# Patient Record
Sex: Female | Born: 1985 | Race: Black or African American | Hispanic: No | Marital: Single | Smoking: Former smoker
Health system: Southern US, Community
[De-identification: ages and names within clinical notes are randomized; demographics above are authoritative.]

## PROBLEM LIST (undated history)

## (undated) ENCOUNTER — Inpatient Hospital Stay (HOSPITAL_COMMUNITY): Payer: Self-pay

## (undated) DIAGNOSIS — N390 Urinary tract infection, site not specified: Secondary | ICD-10-CM

## (undated) DIAGNOSIS — A749 Chlamydial infection, unspecified: Secondary | ICD-10-CM

## (undated) DIAGNOSIS — A599 Trichomoniasis, unspecified: Secondary | ICD-10-CM

---

## 2006-01-31 ENCOUNTER — Ambulatory Visit (HOSPITAL_COMMUNITY): Admission: RE | Admit: 2006-01-31 | Discharge: 2006-01-31 | Payer: Self-pay | Admitting: Gynecology

## 2006-04-10 ENCOUNTER — Ambulatory Visit (HOSPITAL_COMMUNITY): Admission: RE | Admit: 2006-04-10 | Discharge: 2006-04-10 | Payer: Self-pay | Admitting: Gynecology

## 2006-05-01 ENCOUNTER — Ambulatory Visit (HOSPITAL_COMMUNITY): Admission: RE | Admit: 2006-05-01 | Discharge: 2006-05-01 | Payer: Self-pay | Admitting: Gynecology

## 2006-06-27 ENCOUNTER — Inpatient Hospital Stay (HOSPITAL_COMMUNITY): Admission: AD | Admit: 2006-06-27 | Discharge: 2006-06-27 | Payer: Self-pay | Admitting: Gynecology

## 2006-06-27 ENCOUNTER — Ambulatory Visit: Payer: Self-pay | Admitting: *Deleted

## 2006-06-30 ENCOUNTER — Ambulatory Visit: Payer: Self-pay | Admitting: Obstetrics & Gynecology

## 2006-06-30 ENCOUNTER — Ambulatory Visit (HOSPITAL_COMMUNITY): Admission: RE | Admit: 2006-06-30 | Discharge: 2006-06-30 | Payer: Self-pay | Admitting: Obstetrics & Gynecology

## 2006-07-03 ENCOUNTER — Ambulatory Visit: Payer: Self-pay | Admitting: Obstetrics and Gynecology

## 2006-07-03 ENCOUNTER — Inpatient Hospital Stay (HOSPITAL_COMMUNITY): Admission: AD | Admit: 2006-07-03 | Discharge: 2006-07-05 | Payer: Self-pay | Admitting: Obstetrics & Gynecology

## 2006-09-20 ENCOUNTER — Emergency Department (HOSPITAL_COMMUNITY): Admission: EM | Admit: 2006-09-20 | Discharge: 2006-09-20 | Payer: Self-pay | Admitting: Family Medicine

## 2007-12-07 ENCOUNTER — Inpatient Hospital Stay (HOSPITAL_COMMUNITY): Admission: AD | Admit: 2007-12-07 | Discharge: 2007-12-07 | Payer: Self-pay | Admitting: Gynecology

## 2008-05-25 ENCOUNTER — Inpatient Hospital Stay (HOSPITAL_COMMUNITY): Admission: AD | Admit: 2008-05-25 | Discharge: 2008-05-28 | Payer: Self-pay | Admitting: Obstetrics

## 2010-08-30 LAB — RPR: RPR Ser Ql: NONREACTIVE

## 2010-08-30 LAB — CBC
MCHC: 33.9 g/dL (ref 30.0–36.0)
MCV: 90 fL (ref 78.0–100.0)
Platelets: 253 10*3/uL (ref 150–400)
Platelets: 257 10*3/uL (ref 150–400)
RDW: 13.1 % (ref 11.5–15.5)
WBC: 8.7 10*3/uL (ref 4.0–10.5)

## 2011-02-11 LAB — URINALYSIS, ROUTINE W REFLEX MICROSCOPIC
Bilirubin Urine: NEGATIVE
Nitrite: POSITIVE — AB
Specific Gravity, Urine: 1.025
Urobilinogen, UA: 0.2
pH: 6.5

## 2011-02-11 LAB — URINE CULTURE: Colony Count: >=100000

## 2011-02-11 LAB — URINE MICROSCOPIC-ADD ON

## 2011-02-11 LAB — WET PREP, GENITAL: Yeast Wet Prep HPF POC: NONE SEEN

## 2014-04-01 ENCOUNTER — Encounter (HOSPITAL_COMMUNITY): Payer: Self-pay | Admitting: *Deleted

## 2014-04-01 ENCOUNTER — Inpatient Hospital Stay (HOSPITAL_COMMUNITY)
Admission: AD | Admit: 2014-04-01 | Discharge: 2014-04-01 | Disposition: A | Discharge status: Home / Self Care | Payer: Self-pay | Source: Ambulatory Visit | Attending: Family Medicine | Admitting: Family Medicine

## 2014-04-01 DIAGNOSIS — M545 Low back pain: Secondary | ICD-10-CM | POA: Insufficient documentation

## 2014-04-01 DIAGNOSIS — A5901 Trichomonal vulvovaginitis: Secondary | ICD-10-CM

## 2014-04-01 DIAGNOSIS — S39012A Strain of muscle, fascia and tendon of lower back, initial encounter: Secondary | ICD-10-CM

## 2014-04-01 DIAGNOSIS — N390 Urinary tract infection, site not specified: Secondary | ICD-10-CM

## 2014-04-01 DIAGNOSIS — M79605 Pain in left leg: Secondary | ICD-10-CM | POA: Insufficient documentation

## 2014-04-01 DIAGNOSIS — Z202 Contact with and (suspected) exposure to infections with a predominantly sexual mode of transmission: Secondary | ICD-10-CM

## 2014-04-01 HISTORY — DX: Chlamydial infection, unspecified: A74.9

## 2014-04-01 HISTORY — DX: Urinary tract infection, site not specified: N39.0

## 2014-04-01 HISTORY — DX: Trichomoniasis, unspecified: A59.9

## 2014-04-01 LAB — POCT PREGNANCY, URINE: Preg Test, Ur: NEGATIVE

## 2014-04-01 LAB — CBC
HEMATOCRIT: 37.7 % (ref 36.0–46.0)
Hemoglobin: 13.1 g/dL (ref 12.0–15.0)
MCH: 30.9 pg (ref 26.0–34.0)
MCHC: 34.7 g/dL (ref 30.0–36.0)
MCV: 88.9 fL (ref 78.0–100.0)
Platelets: 278 10*3/uL (ref 150–400)
RBC: 4.24 MIL/uL (ref 3.87–5.11)
RDW: 12.7 % (ref 11.5–15.5)
WBC: 9.5 10*3/uL (ref 4.0–10.5)

## 2014-04-01 LAB — URINALYSIS, ROUTINE W REFLEX MICROSCOPIC
Bilirubin Urine: NEGATIVE
Glucose, UA: NEGATIVE mg/dL
KETONES UR: NEGATIVE mg/dL
NITRITE: POSITIVE — AB
PH: 7 (ref 5.0–8.0)
Protein, ur: NEGATIVE mg/dL
SPECIFIC GRAVITY, URINE: 1.01 (ref 1.005–1.030)
UROBILINOGEN UA: 0.2 mg/dL (ref 0.0–1.0)

## 2014-04-01 LAB — WET PREP, GENITAL
Clue Cells Wet Prep HPF POC: NONE SEEN
Yeast Wet Prep HPF POC: NONE SEEN

## 2014-04-01 LAB — URINE MICROSCOPIC-ADD ON

## 2014-04-01 MED ORDER — METRONIDAZOLE 500 MG PO TABS
2000.0000 mg | ORAL_TABLET | Freq: Once | ORAL | Status: AC
  Administered 2014-04-01: 2000 mg via ORAL
  Filled 2014-04-01: qty 4

## 2014-04-01 MED ORDER — IBUPROFEN 200 MG PO TABS: 600.0000 mg | ORAL_TABLET | Freq: Four times a day (QID) | ORAL | Status: DC | PRN

## 2014-04-01 MED ORDER — CYCLOBENZAPRINE HCL 10 MG PO TABS: 5.0000 mg | ORAL_TABLET | Freq: Three times a day (TID) | ORAL | Status: DC | PRN

## 2014-04-01 MED ORDER — SULFAMETHOXAZOLE-TRIMETHOPRIM 800-160 MG PO TABS: 1.0000 | ORAL_TABLET | Freq: Two times a day (BID) | ORAL | Status: DC

## 2014-04-01 NOTE — Discharge Instructions (Signed)
Muscle Cramps and Spasms Muscle cramps and spasms occur when a muscle or muscles tighten and you have no control over this tightening (involuntary muscle contraction). They are a common problem and can develop in any muscle. The most common place is in the calf muscles of the leg. Both muscle cramps and muscle spasms are involuntary muscle contractions, but they also have differences:   Muscle cramps are sporadic and painful. They may last a few seconds to a quarter of an hour. Muscle cramps are often more forceful and last longer than muscle spasms.  Muscle spasms may or may not be painful. They may also last just a few seconds or much longer. CAUSES  It is uncommon for cramps or spasms to be due to a serious underlying problem. In many cases, the cause of cramps or spasms is unknown. Some common causes are:   Overexertion.   Overuse from repetitive motions (doing the same thing over and over).   Remaining in a certain position for a long period of time.   Improper preparation, form, or technique while performing a sport or activity.   Dehydration.   Injury.   Side effects of some medicines.   Abnormally low levels of the salts and ions in your blood (electrolytes), especially potassium and calcium. This could happen if you are taking water pills (diuretics) or you are pregnant.  Some underlying medical problems can make it more likely to develop cramps or spasms. These include, but are not limited to:   Diabetes.   Parkinson disease.   Hormone disorders, such as thyroid problems.   Alcohol abuse.   Diseases specific to muscles, joints, and bones.   Blood vessel disease where not enough blood is getting to the muscles.  HOME CARE INSTRUCTIONS   Stay well hydrated. Drink enough water and fluids to keep your urine clear or pale yellow.  It may be helpful to massage, stretch, and relax the affected muscle.  For tight or tense muscles, use a warm towel, heating  pad, or hot shower water directed to the affected area.  If you are sore or have pain after a cramp or spasm, applying ice to the affected area may relieve discomfort.  Put ice in a plastic bag.  Place a towel between your skin and the bag.  Leave the ice on for 15-20 minutes, 03-04 times a day.  Medicines used to treat a known cause of cramps or spasms may help reduce their frequency or severity. Only take over-the-counter or prescription medicines as directed by your caregiver. SEEK MEDICAL CARE IF:  Your cramps or spasms get more severe, more frequent, or do not improve over time.  MAKE SURE YOU:   Understand these instructions.  Will watch your condition.  Will get help right away if you are not doing well or get worse. Document Released: 10/22/2001 Document Revised: 08/27/2012 Document Reviewed: 04/18/2012 [facility] Patient Information 2015 [place]. This information is not intended to replace advice given to you by your health care provider. Make sure you discuss any questions you have with your health care provider.  Trichomoniasis Trichomoniasis is an infection caused by an organism called Trichomonas. The infection can affect both women and men. In women, the outer female genitalia and the vagina are affected. In men, the penis is mainly affected, but the prostate and other reproductive organs can also be involved. Trichomoniasis is a sexually transmitted infection (STI) and is most often passed to another person through sexual contact.  RISK FACTORS  Having unprotected sexual intercourse.  Having sexual intercourse with an infected partner. SIGNS AND SYMPTOMS  Symptoms of trichomoniasis in women include:  Abnormal gray-green frothy vaginal discharge.  Itching and irritation of the vagina.  Itching and irritation of the area outside the vagina. Symptoms of trichomoniasis in men include:   Penile discharge with or without pain.  Pain during urination. This  results from inflammation of the urethra. DIAGNOSIS  Trichomoniasis may be found during a Pap test or physical exam. Your health care provider may use one of the following methods to help diagnose this infection:  Examining vaginal discharge under a microscope. For men, urethral discharge would be examined.  Testing the pH of the vagina with a test tape.  Using a vaginal swab test that checks for the Trichomonas organism. A test is available that provides results within a few minutes.  Doing a culture test for the organism. This is not usually needed. TREATMENT   You may be given medicine to fight the infection. Women should inform their health care provider if they could be or are pregnant. Some medicines used to treat the infection should not be taken during pregnancy.  Your health care provider may recommend over-the-counter medicines or creams to decrease itching or irritation.  Your sexual partner will need to be treated if infected. HOME CARE INSTRUCTIONS   Take medicines only as directed by your health care provider.  Take over-the-counter medicine for itching or irritation as directed by your health care provider.  Do not have sexual intercourse while you have the infection.  Women should not douche or wear tampons while they have the infection.  Discuss your infection with your partner. Your partner may have gotten the infection from you, or you may have gotten it from your partner.  Have your sex partner get examined and treated if necessary.  Practice safe, informed, and protected sex.  See your health care provider for other STI testing. SEEK MEDICAL CARE IF:   You still have symptoms after you finish your medicine.  You develop abdominal pain.  You have pain when you urinate.  You have bleeding after sexual intercourse.  You develop a rash.  Your medicine makes you sick or makes you throw up (vomit). MAKE SURE YOU:  Understand these instructions.  Will  watch your condition.  Will get help right away if you are not doing well or get worse. Document Released: 10/26/2000 Document Revised: 09/16/2013 Document Reviewed: 02/11/2013 [facility] Patient Information 2015 [place], [place]. This information is not intended to replace advice given to you by your health care provider. Make sure you discuss any questions you have with your health care provider.  Urinary Tract Infection Urinary tract infections (UTIs) can develop anywhere along your urinary tract. Your urinary tract is your body's drainage system for removing wastes and extra water. Your urinary tract includes two kidneys, two ureters, a bladder, and a urethra. Your kidneys are a pair of bean-shaped organs. Each kidney is about the size of your fist. They are located below your ribs, one on each side of your spine. CAUSES Infections are caused by microbes, which are microscopic organisms, including fungi, viruses, and bacteria. These organisms are so small that they can only be seen through a microscope. Bacteria are the microbes that most commonly cause UTIs. SYMPTOMS  Symptoms of UTIs may vary by age and gender of the patient and by the location of the infection. Symptoms in young women typically include a frequent and intense urge to  urinate and a painful, burning feeling in the bladder or urethra during urination. Older women and men are more likely to be tired, shaky, and weak and have muscle aches and abdominal pain. A fever may mean the infection is in your kidneys. Other symptoms of a kidney infection include pain in your back or sides below the ribs, nausea, and vomiting. DIAGNOSIS To diagnose a UTI, your caregiver will ask you about your symptoms. Your caregiver also will ask to provide a urine sample. The urine sample will be tested for bacteria and white blood cells. White blood cells are made by your body to help fight infection. TREATMENT  Typically, UTIs can be treated with medication.  Because most UTIs are caused by a bacterial infection, they usually can be treated with the use of antibiotics. The choice of antibiotic and length of treatment depend on your symptoms and the type of bacteria causing your infection. HOME CARE INSTRUCTIONS  If you were prescribed antibiotics, take them exactly as your caregiver instructs you. Finish the medication even if you feel better after you have only taken some of the medication.  Drink enough water and fluids to keep your urine clear or pale yellow.  Avoid caffeine, tea, and carbonated beverages. They tend to irritate your bladder.  Empty your bladder often. Avoid holding urine for long periods of time.  Empty your bladder before and after sexual intercourse.  After a bowel movement, women should cleanse from front to back. Use each tissue only once. SEEK MEDICAL CARE IF:   You have back pain.  You develop a fever.  Your symptoms do not begin to resolve within 3 days. SEEK IMMEDIATE MEDICAL CARE IF:   You have severe back pain or lower abdominal pain.  You develop chills.  You have nausea or vomiting.  You have continued burning or discomfort with urination. MAKE SURE YOU:   Understand these instructions.  Will watch your condition.  Will get help right away if you are not doing well or get worse. Document Released: 02/09/2005 Document Revised: 11/01/2011 Document Reviewed: 06/10/2011 [facility] Patient Information 2015 [place], [place]. This information is not intended to replace advice given to you by your health care provider. Make sure you discuss any questions you have with your health care provider.

## 2014-04-01 NOTE — MAU Note (Addendum)
Very low back/buttock pain started last night, more on L side and down back of L leg.

## 2014-04-01 NOTE — MAU Note (Signed)
Pain in left buttock started last night.  When she walks it shoots down left leg

## 2014-04-02 ENCOUNTER — Telehealth: Payer: Self-pay

## 2014-04-02 LAB — GC/CHLAMYDIA PROBE AMP
CT Probe RNA: POSITIVE — AB
GC Probe RNA: NEGATIVE

## 2014-04-02 MED ORDER — AZITHROMYCIN 250 MG PO TABS: 1000.0000 mg | ORAL_TABLET | Freq: Once | ORAL | Status: DC

## 2014-04-02 NOTE — Telephone Encounter (Signed)
-----   Message from Pennie BanterMarni W Smith sent at 04/02/2014 10:43 AM EST ----- Telephone call to patient regarding positive chlamydia culture, patient notified.  Patient has not been treated and will [facility] [address]  Instructed patient to notify her partner for treatment.  Report faxed to health department.

## 2014-04-02 NOTE — Telephone Encounter (Signed)
Called pt and left message that [facility] has [facility] [address]  If she has any questions to please give us a call.

## 2014-04-07 NOTE — MAU Provider Note (Signed)
History     CSN: [id number]  Arrival date and time: 04/01/14 [id number]   First Provider Initiated Contact with Patient 04/01/14 1018      Chief Complaint  Patient presents with  . Back Pain   Back Pain This is a new (Onset last night) problem. The current episode started yesterday (While sitting). The problem occurs intermittently (During ambulation and when moving certain ways). The problem has been gradually improving since onset. The pain is present in the sacro-iliac. The quality of the pain is described as shooting. The pain radiates to the left thigh. The pain is at a severity of 8/10. The pain is severe. The pain is the same all the time. The symptoms are aggravated by standing, twisting and position. Stiffness is present in the morning. Associated symptoms include weakness. Pertinent negatives include no abdominal pain, bladder incontinence, bowel incontinence, chest pain, dysuria, fever, headaches, leg pain, numbness, paresis, paresthesias, pelvic pain or tingling.    OB History    Gravida Para Term Preterm AB TAB SAB Ectopic Multiple Living   2 2 2       2       Past Medical History  Diagnosis Date  . Chlamydia   . Urinary tract infection   . Trichomonas infection     Past Surgical History  Procedure Laterality Date  . Vaginal delivery      Family History  Problem Relation Age of Onset  . Hypertension Father   . Diabetes Father   . Diabetes Paternal Grandmother   . Hypertension Paternal Grandmother     History  Substance Use Topics  . Smoking status: Current Every Day Smoker -- 0.25 packs/day    Types: Cigarettes  . Smokeless tobacco: Never Used  . Alcohol Use: No    Allergies: No Known Allergies  No prescriptions prior to admission    Review of Systems  Constitutional: Negative for fever.  Cardiovascular: Negative for chest pain.  Gastrointestinal: Negative for abdominal pain and bowel incontinence.  Genitourinary: Negative for bladder incontinence,  dysuria and pelvic pain.  Musculoskeletal: Positive for back pain.  Neurological: Positive for weakness. Negative for tingling, numbness, headaches and paresthesias.   Physical Exam   Blood pressure 120/78, pulse 72, temperature 98.4 F (36.9 C), temperature source Oral, resp. rate 16, height 5\' 2"  (1.575 m), weight 97.977 kg (216 lb), last menstrual period 02/15/2014.  Physical Exam  Constitutional: She appears well-developed and well-nourished.  Cardiovascular: Normal rate, regular rhythm and normal heart sounds.   Respiratory: Effort normal and breath sounds normal.  Musculoskeletal:  Decreased ROM with flexion. Normal ROM with rotation and rocking side to side.  Skin: Skin is warm and dry.    MAU Course  Procedures Flagyl given in maternity admissions  Assessment and Plan   1. Back strain, initial encounter   2. UTI (lower urinary tract infection)   3. Trichomonas vaginitis   4. STD exposure    Discharge home in stable condition. GC/chlamydia cultures, hep B, hep C, RPR, HIV pending. Expedited partner therapy. No sexual intercourse until one week after both patient and partner have been treated. Always use condoms. Follow-up Information    [facility]   Specialty:  Urgent Care   Why:  As needed if symptoms worsen   Contact information:   [address] [place] [id number] [phone]      [facility] [place]   Why:  for routine gyneology care   Contact information:   [address] [id number] [phone]      Follow up with Pap smears.   Contact information:   [phone]        Medication List    TAKE these medications        cyclobenzaprine 10 MG tablet  Commonly known as:  FLEXERIL  Take 0.5-1 tablets (5-10 mg total) by mouth every 8 (eight) hours as needed for muscle spasms.     ibuprofen 200 MG tablet  Commonly known as:   ADVIL,MOTRIN  Take 3 tablets (600 mg total) by mouth every 6 (six) hours as needed for moderate pain.     sulfamethoxazole-trimethoprim 800-160 MG per tablet  Commonly known as:  BACTRIM DS,SEPTRA DS  Take 1 tablet by mouth 2 (two) times daily.       I was present for the exam and agree with above. PELVIC EXAM: Normal external female genitalia. Moderate amount of creamy, yellowish-white, malodorous discharge. Cervix clean, nonfriable. No mucopurulent discharge. No adnexal masses or tenderness. No cervical motion tenderness.  Zamir Staples 04/07/2014, 4:09 AM

## 2014-04-14 LAB — HIV ANTIBODY (ROUTINE TESTING W REFLEX): HIV 1&2 Ab, 4th Generation: NONREACTIVE

## 2014-04-14 LAB — HEPATITIS B SURFACE ANTIGEN: Hepatitis B Surface Ag: NEGATIVE

## 2014-04-14 LAB — HEPATITIS C ANTIBODY (REFLEX)

## 2014-04-14 LAB — RPR

## 2014-12-07 ENCOUNTER — Emergency Department (HOSPITAL_COMMUNITY)
Admission: EM | Admit: 2014-12-07 | Discharge: 2014-12-07 | Disposition: A | Discharge status: Home / Self Care | Payer: Self-pay | Attending: Emergency Medicine | Admitting: Emergency Medicine

## 2014-12-07 ENCOUNTER — Encounter (HOSPITAL_COMMUNITY): Payer: Self-pay | Admitting: Emergency Medicine

## 2014-12-07 DIAGNOSIS — M545 Low back pain, unspecified: Secondary | ICD-10-CM

## 2014-12-07 DIAGNOSIS — Z792 Long term (current) use of antibiotics: Secondary | ICD-10-CM | POA: Insufficient documentation

## 2014-12-07 DIAGNOSIS — Z3202 Encounter for pregnancy test, result negative: Secondary | ICD-10-CM | POA: Insufficient documentation

## 2014-12-07 DIAGNOSIS — M79651 Pain in right thigh: Secondary | ICD-10-CM | POA: Insufficient documentation

## 2014-12-07 DIAGNOSIS — Z720 Tobacco use: Secondary | ICD-10-CM | POA: Insufficient documentation

## 2014-12-07 DIAGNOSIS — Z87440 Personal history of urinary (tract) infections: Secondary | ICD-10-CM | POA: Insufficient documentation

## 2014-12-07 DIAGNOSIS — Z8619 Personal history of other infectious and parasitic diseases: Secondary | ICD-10-CM | POA: Insufficient documentation

## 2014-12-07 DIAGNOSIS — R350 Frequency of micturition: Secondary | ICD-10-CM | POA: Insufficient documentation

## 2014-12-07 LAB — URINALYSIS, ROUTINE W REFLEX MICROSCOPIC
BILIRUBIN URINE: NEGATIVE
Glucose, UA: NEGATIVE mg/dL
HGB URINE DIPSTICK: NEGATIVE
KETONES UR: NEGATIVE mg/dL
Leukocytes, UA: NEGATIVE
Nitrite: NEGATIVE
Protein, ur: NEGATIVE mg/dL
SPECIFIC GRAVITY, URINE: 1.013 (ref 1.005–1.030)
Urobilinogen, UA: 0.2 mg/dL (ref 0.0–1.0)
pH: 8 (ref 5.0–8.0)

## 2014-12-07 LAB — POC URINE PREG, ED: Preg Test, Ur: NEGATIVE

## 2014-12-07 MED ORDER — IBUPROFEN 400 MG PO TABS
800.0000 mg | ORAL_TABLET | Freq: Once | ORAL | Status: AC
  Administered 2014-12-07: 800 mg via ORAL
  Filled 2014-12-07: qty 2

## 2014-12-07 MED ORDER — IBUPROFEN 800 MG PO TABS: 800.0000 mg | ORAL_TABLET | Freq: Three times a day (TID) | ORAL | Status: DC | PRN

## 2014-12-07 MED ORDER — METHOCARBAMOL 500 MG PO TABS
1000.0000 mg | ORAL_TABLET | Freq: Once | ORAL | Status: AC
  Administered 2014-12-07: 1000 mg via ORAL
  Filled 2014-12-07: qty 2

## 2014-12-07 MED ORDER — METHOCARBAMOL 500 MG PO TABS: 500.0000 mg | ORAL_TABLET | Freq: Four times a day (QID) | ORAL | Status: DC | PRN

## 2014-12-07 NOTE — ED Notes (Signed)
Pt sts several days pain in left lower back down back of leg; pt sts some pain in right upper leg that has now resolved

## 2014-12-07 NOTE — ED Notes (Signed)
Pt is in stable condition upon d/c and ambulates from ED. 

## 2014-12-07 NOTE — Discharge Instructions (Signed)
Read the information below.  Use the prescribed medication as directed.  Please discuss all new medications with your pharmacist.  You may return to the Emergency Department at any time for worsening condition or any new symptoms that concern you.    If you develop fevers, loss of control of bowel or bladder, weakness or numbness in your legs, or are unable to walk, return to the ER for a recheck.  ° °Back Pain, Adult °Low back pain is very common. About 1 in 5 people have back pain. The cause of low back pain is rarely dangerous. The pain often gets better over time. About half of people with a sudden onset of back pain feel better in just 2 weeks. About 8 in 10 people feel better by 6 weeks.  °CAUSES °Some common causes of back pain include: °· Strain of the muscles or ligaments supporting the spine. °· Wear and tear (degeneration) of the spinal discs. °· Arthritis. °· Direct injury to the back. °DIAGNOSIS °Most of the time, the direct cause of low back pain is not known. However, back pain can be treated effectively even when the exact cause of the pain is unknown. Answering your caregiver's questions about your overall health and symptoms is one of the most accurate ways to make sure the cause of your pain is not dangerous. If your caregiver needs more information, he or she may order lab work or imaging tests (X-rays or MRIs). However, even if imaging tests show changes in your back, this usually does not require surgery. °HOME CARE INSTRUCTIONS °For many people, back pain returns. Since low back pain is rarely dangerous, it is often a condition that people can learn to manage on their own.  °· Remain active. It is stressful on the back to sit or stand in one place. Do not sit, drive, or stand in one place for more than 30 minutes at a time. Take short walks on level surfaces as soon as pain allows. Try to increase the length of time you walk each day. °· Do not stay in bed. Resting more than 1 or 2 days can  delay your recovery. °· Do not avoid exercise or work. Your body is made to move. It is not dangerous to be active, even though your back may hurt. Your back will likely heal faster if you return to being active before your pain is gone. °· Pay attention to your body when you  bend and lift. Many people have less discomfort when lifting if they bend their knees, keep the load close to their bodies, and avoid twisting. Often, the most comfortable positions are those that put less stress on your recovering back. °· Find a comfortable position to sleep. Use a firm mattress and lie on your side with your knees slightly bent. If you lie on your back, put a pillow under your knees. °· Only take over-the-counter or prescription medicines as directed by your caregiver. Over-the-counter medicines to reduce pain and inflammation are often the most helpful. Your caregiver may prescribe muscle relaxant drugs. These medicines help dull your pain so you can more quickly return to your normal activities and healthy exercise. °· Put ice on the injured area. °· Put ice in a plastic bag. °· Place a towel between your skin and the bag. °· Leave the ice on for 15-20 minutes, 03-04 times a day for the first 2 to 3 days. After that, ice and heat may be alternated to reduce pain and spasms. °· Ask your caregiver about trying back exercises and gentle massage. This may be of some   benefit.  Avoid feeling anxious or stressed.Stress increases muscle tension and can worsen back pain.It is important to recognize when you are anxious or stressed and learn ways to manage it.Exercise is a great option. SEEK MEDICAL CARE IF:  You have pain that is not relieved with rest or medicine.  You have pain that does not improve in 1 week.  You have new symptoms.  You are generally not feeling well. SEEK IMMEDIATE MEDICAL CARE IF:   You have pain that radiates from your back into your legs.  You develop new bowel or bladder control  problems.  You have unusual weakness or numbness in your arms or legs.  You develop nausea or vomiting.  You develop abdominal pain.  You feel faint. Document Released: 05/02/2005 Document Revised: 11/01/2011 Document Reviewed: 09/03/2013 [facility] Patient Information 2015 [place], [place]. This information is not intended to replace advice given to you by your health care provider. Make sure you discuss any questions you have with your health care provider.  Back Injury Prevention Back injuries can be extremely painful and difficult to heal. After having one back injury, you are much more likely to experience another later on. It is important to learn how to avoid injuring or re-injuring your back. The following tips can help you to prevent a back injury. PHYSICAL FITNESS  Exercise regularly and try to develop good tone in your abdominal muscles. Your abdominal muscles provide a lot of the support needed by your back.  Do aerobic exercises (walking, jogging, biking, swimming) regularly.  Do exercises that increase balance and strength (tai chi, yoga) regularly. This can decrease your risk of falling and injuring your back.  Stretch before and after exercising.  Maintain a healthy weight. The more you weigh, the more stress is placed on your back. For every pound of weight, 10 times that amount of pressure is placed on the back. DIET  Talk to your caregiver about how much calcium and vitamin D you need per day. These nutrients help to prevent weakening of the bones (osteoporosis). Osteoporosis can cause broken (fractured) bones that lead to back pain.  Include good sources of calcium in your diet, such as dairy products, green, leafy vegetables, and products with calcium added (fortified).  Include good sources of vitamin D in your diet, such as milk and foods that are fortified with vitamin D.  Consider taking a nutritional supplement or a multivitamin if needed.  Stop smoking if  you smoke. POSTURE  Sit and stand up straight. Avoid leaning forward when you sit or hunching over when you stand.  Choose chairs with good low back (lumbar) support.  If you work at a desk, sit close to your work so you do not need to lean over. Keep your chin tucked in. Keep your neck drawn back and elbows bent at a right angle. Your arms should look like the letter "L."  Sit high and close to the steering wheel when you drive. Add a lumbar support to your car seat if needed.  Avoid sitting or standing in one position for too long. Take breaks to get up, stretch, and walk around at least once every hour. Take breaks if you are driving for long periods of time.  Sleep on your side with your knees slightly bent, or sleep on your back with a pillow under your knees. Do not sleep on your stomach. LIFTING, TWISTING, AND REACHING  Avoid heavy lifting, especially repetitive lifting. If you must do heavy lifting:  Stretch before lifting.  Work slowly.  Rest between lifts.  Use carts and dollies to move objects when possible.  Make several small trips instead of carrying 1 heavy load.  Ask for help when you need it.  Ask for help when moving big, awkward objects.  Follow these steps when lifting:  Stand with your feet shoulder-width apart.  Get as close to the object as you can. Do not try to pick up heavy objects that are far from your body.  Use handles or lifting straps if they are available.  Bend at your knees. Squat down, but keep your heels off the floor.  Keep your shoulders pulled back, your chin tucked in, and your back straight.  Lift the object slowly, tightening the muscles in your legs, abdomen, and buttocks. Keep the object as close to the center of your body as possible.  When you put a load down, use these same guidelines in reverse.  Do not:  Lift the object above your waist.  Twist at the waist while lifting or carrying a load. Move your feet if you  need to turn, not your waist.  Bend over without bending at your knees.  Avoid reaching over your head, across a table, or for an object on a high surface. OTHER TIPS  Avoid wet floors and keep sidewalks clear of ice to prevent falls.  Do not sleep on a mattress that is too soft or too hard.  Keep items that are used frequently within easy reach.  Put heavier objects on shelves at waist level and lighter objects on lower or higher shelves.  Find ways to decrease your stress, such as exercise, massage, or relaxation techniques. Stress can build up in your muscles. Tense muscles are more vulnerable to injury.  Seek treatment for depression or anxiety if needed. These conditions can increase your risk of developing back pain. SEEK MEDICAL CARE IF:  You injure your back.  You have questions about diet, exercise, or other ways to prevent back injuries. MAKE SURE YOU:  Understand these instructions.  Will watch your condition.  Will get help right away if you are not doing well or get worse. Document Released: 06/09/2004 Document Revised: 07/25/2011 Document Reviewed: 06/13/2011 [facility] Patient Information 2015 [place], [place]. This information is not intended to replace advice given to you by your health care provider. Make sure you discuss any questions you have with your health care provider.   Emergency Department Resource Guide 1) Find a Doctor and Pay Out of Pocket Although you won't have to find out who is covered by your insurance plan, it is a good idea to ask around and get recommendations. You will then need to call the office and see if the doctor you have chosen will accept you as a new patient and what types of options they offer for patients who are self-pay. Some doctors offer discounts or will set up payment plans for their patients who do not have insurance, but you will need to ask so you aren't surprised when you get to your appointment.  2) Contact Your Local  Health Department Not all health departments have doctors that can see patients for sick visits, but many do, so it is worth a call to see if yours does. If you don't know where your local health department is, you can check in your phone book. The CDC also has a tool to help you locate your state's health department, and many state websites also have listings  of all of their local health departments.  3) Find a Walk-in Clinic If your illness is not likely to be very severe or complicated, you may want to try a walk in clinic. These are popping up all over the country in pharmacies, drugstores, and shopping centers. They're usually staffed by nurse practitioners or physician assistants that have been trained to treat common illnesses and complaints. They're usually fairly quick and inexpensive. However, if you have serious medical issues or chronic medical problems, these are probably not your best option.  No Primary Care Doctor: - Call Health Connect at  [phone] - they can help you locate a primary care doctor that  accepts your insurance, provides certain services, etc. - Physician Referral Service- [phone]  Chronic Pain Problems: Organization         Address  Phone   Notes  [facility]  [phone] Patients need to be referred by their primary care doctor.   Medication Assistance: Organization         Address  Phone   Notes  [facility] Medication [facility] [address] Rema Lievanos [place] [place], [place] [address] [phone] --Must be a resident of [facility] -- Must have NO insurance coverage whatsoever (no Medicaid/ Medicare, etc.) -- The pt. MUST have a primary care doctor that directs their care regularly and follows them in the community   [facility]  [phone]   [employer]  [phone]    Agencies that provide inexpensive medical care: Organization         Address  Phone   Notes  [facility]  [phone]    [facility]    [phone]   [facility] [address] [place], [place] [address] [phone]   [facility] [address] [place] [place] [phone]   Planned Parenthood    [phone]   [facility]    [phone]   [facility]  [address], [place] Phone:  [phone], Fax:  [phone] Hours of Operation:  9 am - 6 pm, M-F.  Also accepts Medicaid/Medicare and self-pay.  [facility]  [address], [place] Phone: [phone], Fax: [phone]. Hours of Operation:  8:30 am - 5:30 pm, M-F.  Also accepts Medicaid and self-pay.  [facility] [place] [address] [place] Phone: [phone]   [facility] [address] Natasha Bence [place], [place] [phone], Ext. [phone] Mondays & Thursdays: 7-9 AM.  First 15 patients are seen on a first come, first serve basis.    Medicaid-accepting [facility] Providers:  Organization         Address  Phone   Notes  [facility] [address], [place] [phone] Also accepts self-pay patients.  [facility] [address] [place] [address] Laurell Josephs [place], [place]  [phone]   [facility] [address], [place] [phone]   [facility] [address] [place] [phone]   Renaye Rakers [address], [place]   [phone] Only accepts [place] Access [place] patients after they have their name applied to their card.   Self-Pay (no insurance) in [facility]:  Retail buyer   Notes  Sickle Cell Patients, [facility] [place] [phone]   [facility] [place] [phone]   [facility] [place]  [place],   [phone]   [facility]/Dr. Osei-Bonsu  [address], [place] or [address], [place] [phone] Phone number for both [place] and [place] locations is the same.  [facility] [address], [place] [phone]   [facility] [address] [place] or [address] [phone]   [facility] [address] [place] [phone], phone; [phone], fax Sees patients 1st and 3rd Saturday of every month.  Must not qualify for public or private insurance (i.e. Medicaid, Medicare, [place] Health Choice, Veterans' Benefits)  Household income should be no more than 200% of the poverty level The clinic cannot treat you if you are pregnant or think you are pregnant  Sexually transmitted diseases are not treated at the clinic.    Dental Care: Organization         Address  Phone  Notes  [facility] Department of [facility] [place] [phone] Accepts children up to age 46 who are enrolled in [place] or [place]; pregnant women with a Medicaid card; and children who have applied for Medicaid or [place] Health Choice, but were declined, whose parents can pay a reduced fee at time of service.  [facility]  [address], [place] [phone] Accepts children up to age 54 who are enrolled in [place] or [place]; pregnant women with a Medicaid card; and children who have applied for Medicaid or [place] Health Choice, but were declined, whose parents can pay a reduced fee at time of service.  [facility]  [place] [phone] Patients are seen by appointment only. Walk-ins are not accepted. [place] will see patients 33 years of age and older. Monday - Tuesday (8am-5pm) Most Wednesdays  (8:30-5pm) $30 per visit, cash only  [facility] Adult [facility] Access PRO[facility]  [address], [facility] [phone] Patients are seen by appointment only. Walk-ins are not accepted. [place] will see patients 89 years of age and older. One Wednesday Evening (Monthly: Volunteer Based).  $30 per visit, cash only  [place]  [phone] for adults; Children under age 59, call Graduate Pediatric Dentistry at [phone]. Children aged 98-14, please call [phone] to request a pediatric application.  Dental services are provided in all areas of dental care including fillings, crowns and bridges, complete and partial dentures, implants, gum treatment, root canals, and extractions. Preventive care is also provided. Treatment is provided to both adults and children. Patients are selected via a lottery and there is often a waiting list.   [facility] [address] [place]  [phone] [email or web address]   [facility] [address] [place], [place] [phone], Ext. [phone] Second and Fourth Thursday of each month, opens at 6:30 AM; Clinic ends at 9 AM.  Patients are seen on a first-come first-served basis, and a limited number are seen during each clinic.   [facility]  [address] Hillard Danker [place], [place] [phone]   Eligibility  Requirements You must have lived in [place], [place], or [place] counties for at least the last three months.   You cannot be eligible for state or federal sponsored [employer], including [employer], [place], or [employer].   You generally cannot be eligible for healthcare insurance through your employer.    How to apply: Eligibility screenings are held every Tuesday and Wednesday afternoon from 1:00 pm until 4:00 pm. You do not need an appointment for the interview!  [facility] [address] [place], [place]   [place] Department  [place]  [phone]    Behavioral Health Resources in the Community: Intensive Outpatient Programs Organization         Address  Phone  Notes  [place]. [address] [place], [place] [phone]   [facility]patient [address] [place], [place]   [facility]: [facility] [address] [place], [place]   [place] [address],  [place], [place] or [phone]   Substance Abuse Resources Organization         Address  Phone  Notes  Al[facility] Drug Services  [phone]   [place]  [phone]   [facility] [place]   Chinita Pester  [phone]   Residential & Outpatient Substance Abuse Program  [phone]   Psychological Services Organization         Address  Phone  Notes  [facility] [place]  [place]  [phone]   [place] [address] [place] or [phone]    Mobile Crisis Teams Organization         Address  Phone  Notes  [facility], Mobile Crisis Care Unit  [phone]   [facility]  [address] [place], [place]   [place] Levels [address] [place] [phone]    Self-Help/Support Groups Organization         Address  Phone             Notes  Kevin. of [place] - variety of support groups  [place] Call for more information  Narcotics Anonymous (NA), [facility] [address], [employer] [place]  2 meetings at this location   Special educational needs teacher         Address  Phone  Notes  [facility] [place],    [place]  [phone]   [facility]  [address] [place] [address]88110, [place], [place]   [place], [place] [phone] Admissions: 8am-3pm M-F  Incentives Substance [place] [address],    [place], [place] [phone]   [facility] [address] Jadene Pierini [place], [place]   [facility] [address],  [place], [place]   [facility] - Intensive Outpatient [address] Kristeen [address], [place], [place]   [facility] ([place].,  [place], [place] or [phone]   [facility] (RTS) [address] [place], [place] Accepts Medicaid  [facility] [place] [address],  [place] [phone] Substance Abuse/Addiction Treatment   [facility] Resources Organization  Address  Phone  Notes  [facility]  [phone]   Domenic Schwab, PhD [address] Arlis Porta [place], [place]   [phone] or [phone]   [place], [place] [phone]   [place] [address], [place], [place] [phone] Insurance/Medicaid/sponsorship through [facility] [place] and Families [address]                                    [place], [place] [phone] [place] [address], [place] [phone]    Dr. Adele Schilder  [phone]   [facility] [facility] Dept. 1) [address] [place] 2) [place] 3)  [address], [place] [phone]  [phone]   [place] [phone] or [phone] (After Hours)

## 2014-12-07 NOTE — ED Provider Notes (Signed)
CSN: [id number]     Arrival date & time 12/07/14  0911 History  This chart was scribed for non-physician practitioner, Trixie Dredge, PA-C working with Richardean Canal, MD by Placido Sou, ED scribe. This patient was seen in room TR11C/TR11C and the patient's care was started at 10:15 AM.   Chief Complaint  Patient presents with  . Back Pain   The history is provided by the patient. No language interpreter was used.   HPI Comments: Angel Pineda is a 29 y.o. female who presents to the Emergency Department complaining of constant, moderate, left lower back pain with onset 3 days ago. Pt notes that the pain began on the inside of her right thigh while swimming for exercise, which she does regularly, and is now located on her lower left back and left buttock. She denies that there is any further pain in her right thigh.  She notes a worsening of pain when ambulating, movement, and certain positions. She denies taking any medications for her pain PTA. Pt denies any recent trauma, heavy lifting, falls, vaginal pain discharge or bleeding, numbness or weakness of the extremities, fever/chills, abd pain, dysuria/frequency/urgency, bloody stool, constipation and n/v/d.   Past Medical History  Diagnosis Date  . Chlamydia   . Urinary tract infection   . Trichomonas infection    Past Surgical History  Procedure Laterality Date  . Vaginal delivery     Family History  Problem Relation Age of Onset  . Hypertension Father   . Diabetes Father   . Diabetes Paternal Grandmother   . Hypertension Paternal Grandmother    History  Substance Use Topics  . Smoking status: Current Every Day Smoker -- 0.25 packs/day    Types: Cigarettes  . Smokeless tobacco: Never Used  . Alcohol Use: No   OB History    Gravida Para Term Preterm AB TAB SAB Ectopic Multiple Living   2 2 2       2      Review of Systems  Genitourinary: Positive for frequency.  Musculoskeletal: Positive for myalgias and back pain.  All  other systems reviewed and are negative.   Allergies  Review of patient's allergies indicates no known allergies.  Home Medications   Prior to Admission medications   Medication Sig Start Date End Date Taking? Authorizing Provider  azithromycin (ZITHROMAX) 250 MG tablet Take 4 tablets (1,000 mg total) by mouth once. 04/02/14   Marlis Edelson, CNM  cyclobenzaprine (FLEXERIL) 10 MG tablet Take 0.5-1 tablets (5-10 mg total) by mouth every 8 (eight) hours as needed for muscle spasms. 04/01/14   Dorathy Kinsman, CNM  ibuprofen (ADVIL,MOTRIN) 200 MG tablet Take 3 tablets (600 mg total) by mouth every 6 (six) hours as needed for moderate pain. 04/01/14   Dorathy Kinsman, CNM  sulfamethoxazole-trimethoprim (BACTRIM DS,SEPTRA DS) 800-160 MG per tablet Take 1 tablet by mouth 2 (two) times daily. 04/01/14   Virginia Smith, CNM   BP 119/60 mmHg  Pulse 65  Temp(Src) 98.3 F (36.8 C) (Oral)  Resp 18  Ht 5\' 3"  (1.6 m)  Wt 212 lb (96.163 kg)  BMI 37.56 kg/m2  SpO2 99% Physical Exam  Constitutional: She appears well-developed and well-nourished. No distress.  HENT:  Head: Normocephalic and atraumatic.  Neck: Neck supple.  Pulmonary/Chest: Effort normal.  Abdominal: Soft. She exhibits no distension and no mass. There is no tenderness. There is no rebound and no guarding.  Musculoskeletal:  Spine nontender, no crepitus, or stepoffs. Lower extremities:  Strength 5/5,  sensation intact, distal pulses intact.    Negative straight leg raise  Neurological: She is alert.  Gait is normal   Skin: She is not diaphoretic.  Nursing note and vitals reviewed.   ED Course  Procedures  DIAGNOSTIC STUDIES: Oxygen Saturation is 99% on RA, normal by my interpretation.    COORDINATION OF CARE: 10:24 AM Discussed treatment plan with pt at bedside and pt agreed to plan.  Labs Review Labs Reviewed - No data to display  Imaging Review No results found.   EKG Interpretation None      MDM   Final  diagnoses:  Left-sided low back pain without sciatica    Afebrile, nontoxic patient with left low back pain. No red flags.  No bony tenderness.  Neurovascularly intact.  UA and Upreg negative.  D/C home with ibuprofen, robaxin.  PCP follow up.  Discussed result, findings, treatment, and follow up  with patient.  Pt given return precautions.  Pt verbalizes understanding and agrees with plan.       I personally performed the services described in this documentation, which was scribed in my presence. The recorded information has been reviewed and is accurate.    Trixie Dredge, PA-C 12/07/14 1140  Richardean Canal, MD 12/08/14 [phone]

## 2015-06-24 ENCOUNTER — Emergency Department (HOSPITAL_COMMUNITY)
Admission: EM | Admit: 2015-06-24 | Discharge: 2015-06-24 | Disposition: A | Discharge status: Home / Self Care | Payer: MEDICAID | Attending: Emergency Medicine | Admitting: Emergency Medicine

## 2015-06-24 ENCOUNTER — Encounter (HOSPITAL_COMMUNITY): Payer: Self-pay | Admitting: Emergency Medicine

## 2015-06-24 DIAGNOSIS — K0889 Other specified disorders of teeth and supporting structures: Secondary | ICD-10-CM

## 2015-06-24 DIAGNOSIS — Z792 Long term (current) use of antibiotics: Secondary | ICD-10-CM | POA: Insufficient documentation

## 2015-06-24 DIAGNOSIS — F17210 Nicotine dependence, cigarettes, uncomplicated: Secondary | ICD-10-CM | POA: Insufficient documentation

## 2015-06-24 DIAGNOSIS — K029 Dental caries, unspecified: Secondary | ICD-10-CM | POA: Insufficient documentation

## 2015-06-24 DIAGNOSIS — Z8619 Personal history of other infectious and parasitic diseases: Secondary | ICD-10-CM | POA: Insufficient documentation

## 2015-06-24 DIAGNOSIS — Z87440 Personal history of urinary (tract) infections: Secondary | ICD-10-CM | POA: Insufficient documentation

## 2015-06-24 MED ORDER — PENICILLIN V POTASSIUM 500 MG PO TABS: 500.0000 mg | ORAL_TABLET | Freq: Four times a day (QID) | ORAL | Status: DC

## 2015-06-24 NOTE — ED Provider Notes (Signed)
`CSN: [id number]     Arrival date & time 06/24/15  0935 History  By signing my name below, I, Angel Pineda, attest that this documentation has been prepared under the direction and in the presence of Angel Horseman, PA-C Electronically Signed: Soijett Pineda, ED Scribe. 06/24/2015. 9:44 AM.   Chief Complaint  Patient presents with  . Dental Pain      The history is provided by the patient. No language interpreter was used.    Angel Pineda is a 30 y.o. female who presents to the Emergency Department complaining of constant left lower dental pain onset yesterday morning. She notes that she doesn't have a dentist due to no insurance. She states that she is having associated symptoms of left lower facial swelling. She states that she has tried ibuprofen, orajel, and OTC remedies with no relief for her symptoms. She denies fever, chills, and any other symptoms. Pt denies allergies to any medications.    Past Medical History  Diagnosis Date  . Chlamydia   . Urinary tract infection   . Trichomonas infection    Past Surgical History  Procedure Laterality Date  . Vaginal delivery     Family History  Problem Relation Age of Onset  . Hypertension Father   . Diabetes Father   . Diabetes Paternal Grandmother   . Hypertension Paternal Grandmother    Social History  Substance Use Topics  . Smoking status: Current Every Day Smoker -- 0.25 packs/day    Types: Cigarettes  . Smokeless tobacco: Never Used  . Alcohol Use: No   OB History    Gravida Para Term Preterm AB TAB SAB Ectopic Multiple Living   Review of Systems  Constitutional: Negative for fever.  HENT: Positive for dental problem and facial swelling.   Skin: Negative for color change.      Allergies  Review of patient's allergies indicates no known allergies.  Home Medications   Prior to Admission medications   Medication Sig Start Date End Date Taking? Authorizing Provider   azithromycin (ZITHROMAX) 250 MG tablet Take 4 tablets (1,000 mg total) by mouth once. 04/02/14   Marlis Edelson, CNM  ibuprofen (ADVIL,MOTRIN) 800 MG tablet Take 1 tablet (800 mg total) by mouth every 8 (eight) hours as needed for mild pain or moderate pain. 12/07/14   Trixie Dredge, PA-C  methocarbamol (ROBAXIN) 500 MG tablet Take 1-2 tablets (500-1,000 mg total) by mouth every 6 (six) hours as needed for muscle spasms (or pain). 12/07/14   Trixie Dredge, PA-C  sulfamethoxazole-trimethoprim (BACTRIM DS,SEPTRA DS) 800-160 MG per tablet Take 1 tablet by mouth 2 (two) times daily. 04/01/14   Virginia Smith, CNM   BP 134/81 mmHg  Pulse 65  Temp(Src) 99.4 F (37.4 C) (Oral)  Resp 18  SpO2 100% Physical Exam  Physical Exam  Constitutional: Pt appears well-developed and well-nourished.  HENT:  Head: Normocephalic.  Right Ear: Tympanic membrane, external ear and ear canal normal.  Left Ear: Tympanic membrane, external ear and ear canal normal.  Nose: Nose normal. Right sinus exhibits no maxillary sinus tenderness and no frontal sinus tenderness. Left sinus exhibits no maxillary sinus tenderness and no frontal sinus tenderness.  Mouth/Throat: Uvula is midline, oropharynx is clear and moist and mucous membranes are normal. No oral lesions. Abnormal dentition. Dental caries present. No uvula swelling or lacerations. No oropharyngeal exudate, posterior oropharyngeal edema, posterior oropharyngeal erythema or tonsillar abscesses.  No  gingival swelling, fluctuance or induration No gross abscess  Eyes: Conjunctivae are normal. Pupils are equal, round, and reactive to light. Right eye exhibits no discharge. Left eye exhibits no discharge.  Neck: Normal range of motion. Neck supple.  No stridor Handling secretions without difficulty No nuchal rigidity No cervical lymphadenopathy   Cardiovascular: Normal rate, regular rhythm and normal heart sounds.   Pulmonary/Chest: Effort normal. No respiratory distress.   Equal chest rise  Abdominal: Soft. Bowel sounds are normal. Pt exhibits no distension. There is no tenderness.  Lymphadenopathy:    Pt has no cervical adenopathy.  Neurological: Pt is alert.  Skin: Skin is warm and dry.  Psychiatric: Pt has a normal mood and affect.  Nursing note and vitals reviewed.   ED Course  Procedures (including critical care time) DIAGNOSTIC STUDIES: Oxygen Saturation is 100% on RA, nl by my interpretation.    COORDINATION OF CARE: 9:45 AM Discussed treatment plan with pt at bedside which includes use tylenol or ibuprofen PRN, abx Rx, referral and follow up with dentist and pt agreed to plan.      MDM   Final diagnoses:  Pain, dental    Patient with dentalgia.  No abscess requiring immediate incision and drainage.  Exam not concerning for Ludwig's angina or pharyngeal abscess.  Will treat with penicillin Rx, use of tylenol or Ibuprofen PRN. Pt instructed to follow-up with dentist.  Discussed return precautions. Pt safe for discharge.   I personally performed the services described in this documentation, which was scribed in my presence. The recorded information has been reviewed and is accurate.     Angel Horseman, PA-C 06/24/15 1610  Tilden Fossa, MD 06/25/15 [phone]

## 2015-06-24 NOTE — ED Notes (Signed)
See PA assessment 

## 2015-06-24 NOTE — Discharge Instructions (Signed)

## 2015-06-24 NOTE — ED Notes (Signed)
Patient states L lower dental pain for 2 x days.   Patient does have some swelling in lower jaw.  Denies other problems.

## 2016-10-19 ENCOUNTER — Encounter (HOSPITAL_COMMUNITY): Payer: Self-pay

## 2016-10-19 ENCOUNTER — Emergency Department (HOSPITAL_COMMUNITY)
Admission: EM | Admit: 2016-10-19 | Discharge: 2016-10-19 | Disposition: A | Discharge status: Home / Self Care | Payer: Medicaid Other | Attending: Emergency Medicine | Admitting: Emergency Medicine

## 2016-10-19 DIAGNOSIS — Z79899 Other long term (current) drug therapy: Secondary | ICD-10-CM | POA: Insufficient documentation

## 2016-10-19 DIAGNOSIS — F17210 Nicotine dependence, cigarettes, uncomplicated: Secondary | ICD-10-CM | POA: Insufficient documentation

## 2016-10-19 DIAGNOSIS — M549 Dorsalgia, unspecified: Secondary | ICD-10-CM | POA: Insufficient documentation

## 2016-10-19 DIAGNOSIS — M62838 Other muscle spasm: Secondary | ICD-10-CM

## 2016-10-19 MED ORDER — METHOCARBAMOL 500 MG PO TABS
1000.0000 mg | ORAL_TABLET | Freq: Three times a day (TID) | ORAL | 0 refills | Status: DC
Start: 2016-10-19 — End: 2018-01-02

## 2016-10-19 MED ORDER — NAPROXEN 500 MG PO TABS: 500.0000 mg | ORAL_TABLET | Freq: Two times a day (BID) | ORAL | 0 refills | Status: DC

## 2016-10-19 NOTE — ED Provider Notes (Signed)
[facility] Provider Note   CSN: [id number] Arrival date & time: 10/19/16  4098     History   Chief Complaint Chief Complaint  Patient presents with  . back/neck pain    HPI Angel Pineda is a 31 y.o. female.  HPI Patient has had left-sided neck pain for 3 days. She reports it is tight, aching and radiates. She reports that that the back of her neck on the left side and comes out across the top of her shoulder and goes down her back. It is much worse with bending her head forward or turning it toward the right. Pain is also worse with elevating the left arm greater than 90. No weakness numbness or tingling. No injury. Patient does work waiting tables and carries trays. No associated headache or fever. Past Medical History:  Diagnosis Date  . Chlamydia   . Trichomonas infection   . Urinary tract infection     There are no active problems to display for this patient.   Past Surgical History:  Procedure Laterality Date  . VAGINAL DELIVERY      OB History    Gravida Para Term Preterm AB Living   2 2 2     2    SAB TAB Ectopic Multiple Live Births                   Home Medications    Prior to Admission medications   Medication Sig Start Date End Date Taking? Authorizing Provider  azithromycin (ZITHROMAX) 250 MG tablet Take 4 tablets (1,000 mg total) by mouth once. 04/02/14   Marlis Edelson, CNM  ibuprofen (ADVIL,MOTRIN) 800 MG tablet Take 1 tablet (800 mg total) by mouth every 8 (eight) hours as needed for mild pain or moderate pain. 12/07/14   Trixie Dredge, PA-C  methocarbamol (ROBAXIN) 500 MG tablet Take 1-2 tablets (500-1,000 mg total) by mouth every 6 (six) hours as needed for muscle spasms (or pain). 12/07/14   Trixie Dredge, PA-C  methocarbamol (ROBAXIN) 500 MG tablet Take 2 tablets (1,000 mg total) by mouth 3 (three) times daily. 10/19/16   Arby Barrette, MD  naproxen (NAPROSYN) 500 MG tablet Take 1 tablet (500 mg total) by mouth 2 (two) times daily. 10/19/16    Arby Barrette, MD  penicillin v potassium (VEETID) 500 MG tablet Take 1 tablet (500 mg total) by mouth 4 (four) times daily. 06/24/15   Roxy Horseman, PA-C  sulfamethoxazole-trimethoprim (BACTRIM DS,SEPTRA DS) 800-160 MG per tablet Take 1 tablet by mouth 2 (two) times daily. 04/01/14   Dorathy Kinsman, CNM    Family History Family History  Problem Relation Age of Onset  . Hypertension Father   . Diabetes Father   . Diabetes Paternal Grandmother   . Hypertension Paternal Grandmother     Social History Social History  Substance Use Topics  . Smoking status: Current Every Day Smoker    Packs/day: 0.10    Types: Cigarettes  . Smokeless tobacco: Never Used  . Alcohol use No     Allergies   Patient has no known allergies.   Review of Systems Review of Systems 10 Systems reviewed and are negative for acute change except as noted in the HPI.   Physical Exam Updated Vital Signs BP (!) 144/97 (BP Location: Left Arm)   Pulse 80   Temp 98 F (36.7 C) (Oral)   Resp 18   SpO2 99%   Physical Exam  Constitutional: She is oriented to person, place, and time.  She appears well-developed and well-nourished. No distress.  HENT:  Head: Normocephalic and atraumatic.  Right Ear: External ear normal.  Left Ear: External ear normal.  Nose: Nose normal.  Eyes: Conjunctivae and EOM are normal.  Neck: Neck supple.  Very tender to palpation along the para cervical muscle bodies of the left neck. Several areas of point tenderness along the trapezius and medial point of the scapula. Pain is worse with forward flexion of the neck and abduction of the left arm greater than 90. Soft tissues are otherwise supple without mass or lymphadenopathy.  Cardiovascular: Normal rate and regular rhythm.   No murmur heard. Pulmonary/Chest: Effort normal and breath sounds normal. No respiratory distress.  Abdominal: She exhibits no distension.  Musculoskeletal: She exhibits tenderness. She exhibits no  edema.  No swelling or soft tissue anomaly of the left upper cavity. Radial pulses 2+. Hand grip strength is 5\5. Motor strength is intact. Patient does have pain is reproducible with abduction rated the 90 pain localizes to the area of the rhomboids and trapezius in the area of the upper and medial scapula  Neurological: She is alert and oriented to person, place, and time. No sensory deficit. She exhibits normal muscle tone. Coordination normal.  Skin: Skin is warm and dry.  Psychiatric: She has a normal mood and affect.  Nursing note and vitals reviewed.    ED Treatments / Results  Labs (all labs ordered are listed, but only abnormal results are displayed) Labs Reviewed - No data to display  EKG  EKG Interpretation None       Radiology No results found.  Procedures Procedures (including critical care time)  Medications Ordered in ED Medications - No data to display   Initial Impression / Assessment and Plan / ED Course  I have reviewed the triage vital signs and the nursing notes.  Pertinent labs & imaging results that were available during my care of the patient were reviewed by me and considered in my medical decision making (see chart for details).      Final Clinical Impressions(s) / ED Diagnoses   Final diagnoses:  Cervical paraspinal muscle spasm   Findings consistent with paraspinous muscle spasm likely due to overuse. No neurovascular compromise. No signs of infectious illness. At this time patient will initiate Robaxin and naproxen. Exercises are provided in discharge instructions. Patient is counseled finding PCP for follow-up. New Prescriptions New Prescriptions   METHOCARBAMOL (ROBAXIN) 500 MG TABLET    Take 2 tablets (1,000 mg total) by mouth 3 (three) times daily.   NAPROXEN (NAPROSYN) 500 MG TABLET    Take 1 tablet (500 mg total) by mouth 2 (two) times daily.     Arby BarrettePfeiffer, Deriyah Kunath, MD 10/19/16 [phone]

## 2016-10-19 NOTE — ED Triage Notes (Signed)
Patient complains of back and neck pain since Sunday. Pain with any ROM and pain increased with any movement of neck. 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 No neuro deficits. NAD

## 2017-02-23 ENCOUNTER — Encounter (HOSPITAL_COMMUNITY): Payer: Self-pay | Admitting: Emergency Medicine

## 2017-02-23 ENCOUNTER — Ambulatory Visit (HOSPITAL_COMMUNITY)
Admission: EM | Admit: 2017-02-23 | Discharge: 2017-02-23 | Disposition: A | Discharge status: Home / Self Care | Payer: Self-pay | Attending: Family Medicine | Admitting: Family Medicine

## 2017-02-23 DIAGNOSIS — R0602 Shortness of breath: Secondary | ICD-10-CM

## 2017-02-23 DIAGNOSIS — R42 Dizziness and giddiness: Secondary | ICD-10-CM

## 2017-02-23 LAB — POCT I-STAT, CHEM 8
BUN: 6 mg/dL (ref 6–20)
Calcium, Ion: 1.17 mmol/L (ref 1.15–1.40)
Chloride: 103 mmol/L (ref 101–111)
Creatinine, Ser: 0.7 mg/dL (ref 0.44–1.00)
Glucose, Bld: 90 mg/dL (ref 65–99)
HCT: 41 % (ref 36.0–46.0)
Hemoglobin: 13.9 g/dL (ref 12.0–15.0)
Potassium: 3.8 mmol/L (ref 3.5–5.1)
SODIUM: 141 mmol/L (ref 135–145)
TCO2: 25 mmol/L (ref 22–32)

## 2017-02-23 NOTE — ED Triage Notes (Signed)
Dizziness, sob, chest pain that started on Sunday.  Symptoms are intermittent.  This episode started around 4 while at work.  Denies cough, denies fever.  Patient denies feeling like this before

## 2017-02-23 NOTE — ED Provider Notes (Signed)
[facility]    CSN: [id number] Arrival date & time: 02/23/17  1802     History   Chief Complaint Chief Complaint  Patient presents with  . Dizziness    HPI Angel Pineda is a 31 y.o. female.    31 yo black female presents with mild dyspnea, dizziness at times. Today she this began "out of nowhere". She was not exerting herself. She had a similar episode on Sunday that resolved without treatment. She denies chest pain, diaphoresis, or N, V. She denies a recent illness. No cough or congestion is noted. She denies recent stressors though she states people at work suspect anxiety. She is not sure is this could be an issues. She denies new medications and no past history of cardiac or pulmonary issues. She did receive a Tb skin test today.       Past Medical History:  Diagnosis Date  . Chlamydia   . Trichomonas infection   . Urinary tract infection     There are no active problems to display for this patient.   Past Surgical History:  Procedure Laterality Date  . VAGINAL DELIVERY      OB History    Gravida Para Term Preterm AB Living   SAB TAB Ectopic Multiple Live Births                   Home Medications    Prior to Admission medications   Medication Sig Start Date End Date Taking? Authorizing Provider  azithromycin (ZITHROMAX) 250 MG tablet Take 4 tablets (1,000 mg total) by mouth once. 04/02/14   Marlis Edelson, CNM  ibuprofen (ADVIL,MOTRIN) 800 MG tablet Take 1 tablet (800 mg total) by mouth every 8 (eight) hours as needed for mild pain or moderate pain. 12/07/14   Trixie Dredge, PA-C  methocarbamol (ROBAXIN) 500 MG tablet Take 1-2 tablets (500-1,000 mg total) by mouth every 6 (six) hours as needed for muscle spasms (or pain). 12/07/14   Trixie Dredge, PA-C  methocarbamol (ROBAXIN) 500 MG tablet Take 2 tablets (1,000 mg total) by mouth 3 (three) times daily. 10/19/16   Arby Barrette, MD  naproxen (NAPROSYN) 500 MG tablet Take 1 tablet  (500 mg total) by mouth 2 (two) times daily. 10/19/16   Arby Barrette, MD  penicillin v potassium (VEETID) 500 MG tablet Take 1 tablet (500 mg total) by mouth 4 (four) times daily. 06/24/15   Roxy Horseman, PA-C  sulfamethoxazole-trimethoprim (BACTRIM DS,SEPTRA DS) 800-160 MG per tablet Take 1 tablet by mouth 2 (two) times daily. 04/01/14   Dorathy Kinsman, CNM    Family History Family History  Problem Relation Age of Onset  . Hypertension Father   . Diabetes Father   . Diabetes Paternal Grandmother   . Hypertension Paternal Grandmother     Social History Social History  Substance Use Topics  . Smoking status: Current Every Day Smoker    Packs/day: 0.10    Types: Cigarettes  . Smokeless tobacco: Never Used  . Alcohol use No     Allergies   Patient has no known allergies.   Review of Systems Review of Systems  Constitutional: Negative for chills and fever.  HENT: Negative.   Respiratory: Positive for shortness of breath. Negative for cough, chest tightness and wheezing.   Cardiovascular: Negative for chest pain and palpitations.  Gastrointestinal: Negative for abdominal pain.  Musculoskeletal: Negative.   Neurological: Positive for dizziness. Negative for weakness,  light-headedness, numbness and headaches.  Psychiatric/Behavioral: Negative.      Physical Exam Triage Vital Signs ED Triage Vitals [02/23/17 1818]  Enc Vitals Group     BP 93/66     Pulse Rate 73     Resp (!) 22     Temp 98.2 F (36.8 C)     Temp Source Oral     SpO2 99 %     Weight      Height      Head Circumference      Peak Flow      Pain Score      Pain Loc      Pain Edu?      Excl. in GC?    No data found.   Updated Vital Signs BP 93/66 (BP Location: Left Arm) Comment (BP Location): large cuff  Pulse 73   Temp 98.2 F (36.8 C) (Oral)   Resp (!) 22   LMP 02/13/2017   SpO2 99%   Visual Acuity Right Eye Distance:   Left Eye Distance:   Bilateral Distance:    Right Eye  Near:   Left Eye Near:    Bilateral Near:     Physical Exam  Constitutional: She is oriented to person, place, and time. She appears well-developed and well-nourished. No distress.  Patient is sitting calmly on exam table in NAD. No respiratory distress and non use of accessory muscles to breathe  Neck: No JVD present.  Cardiovascular: Normal rate and regular rhythm.   Pulmonary/Chest: Effort normal and breath sounds normal. No stridor. No respiratory distress. She has no wheezes.  Neurological: She is alert and oriented to person, place, and time. No cranial nerve deficit. She exhibits normal muscle tone.  Skin: Skin is warm and dry. She is not diaphoretic.  Psychiatric: Her behavior is normal.  Nursing note and vitals reviewed.    UC Treatments / Results  Labs (all labs ordered are listed, but only abnormal results are displayed) Labs Reviewed - No data to display  EKG  EKG Interpretation None       Radiology No results found.  Procedures Procedures (including critical care time)  Medications Ordered in UC Medications - No data to display   Initial Impression / Assessment and Plan / UC Course  I have reviewed the triage vital signs and the nursing notes.  Pertinent labs & imaging results that were available during my care of the patient were reviewed by me and considered in my medical decision making (see chart for details).    Work up here was normal. Patient is non-toxic without evidence of an emergent situation. Treat with rest and fluids and suggest further f/u with a PCP such as [facility]. Certainly if she worsens then present to the ED for further evaluation.   Final Clinical Impressions(s) / UC Diagnoses   Final diagnoses:  None    New Prescriptions New Prescriptions   No medications on file     Controlled Substance Prescriptions [place] Controlled Substance Registry consulted? Not Applicable   Sharin Mons 02/23/17 1952

## 2017-02-23 NOTE — Discharge Instructions (Signed)
As we discussed your EKG, Exam and blood work look well. Etiology is unclear, but recommend f/u with a family practice for a further evaluation if this continues. Certainly if worsens would f/u in the ED. Feel better.

## 2017-04-08 ENCOUNTER — Emergency Department (HOSPITAL_COMMUNITY)
Admission: EM | Admit: 2017-04-08 | Discharge: 2017-04-08 | Disposition: A | Discharge status: Home / Self Care | Payer: Self-pay | Attending: Emergency Medicine | Admitting: Emergency Medicine

## 2017-04-08 ENCOUNTER — Encounter (HOSPITAL_COMMUNITY): Payer: Self-pay

## 2017-04-08 DIAGNOSIS — M5432 Sciatica, left side: Secondary | ICD-10-CM | POA: Insufficient documentation

## 2017-04-08 DIAGNOSIS — Z79899 Other long term (current) drug therapy: Secondary | ICD-10-CM | POA: Insufficient documentation

## 2017-04-08 DIAGNOSIS — Z87891 Personal history of nicotine dependence: Secondary | ICD-10-CM | POA: Insufficient documentation

## 2017-04-08 MED ORDER — CYCLOBENZAPRINE HCL 10 MG PO TABS: 10.0000 mg | ORAL_TABLET | Freq: Two times a day (BID) | ORAL | 0 refills | Status: DC | PRN

## 2017-04-08 MED ORDER — DICLOFENAC SODIUM 50 MG PO TBEC: 50.0000 mg | DELAYED_RELEASE_TABLET | Freq: Two times a day (BID) | ORAL | 0 refills | Status: DC

## 2017-04-08 NOTE — ED Triage Notes (Signed)
Patient complains of lower back pain with radiation down left buttock x 2 days, states that she does a lot of patient moving and pulling and thinks pain related to same

## 2017-04-08 NOTE — ED Provider Notes (Signed)
[facility] Provider Note   CSN: [id number] Arrival date & time: 04/08/17  [id number]     History   Chief Complaint Chief Complaint  Patient presents with  . Back Pain    HPI Angel Pineda is a 31 y.o. female who presents to the ED with back pain. The pain is located in the lower back and radiates down the left buttock. The pain started 2 days ago. Patient reports doing a lot of patient moving, pulling and lifting and thinks the pain resulted from that. Patient denies loss of control of bladder or bowels and denies UTI symptoms   HPI  Past Medical History:  Diagnosis Date  . Chlamydia   . Trichomonas infection   . Urinary tract infection     There are no active problems to display for this patient.   Past Surgical History:  Procedure Laterality Date  . VAGINAL DELIVERY      OB History    Gravida Para Term Preterm AB Living   2 2 2     2    SAB TAB Ectopic Multiple Live Births                   Home Medications    Prior to Admission medications   Medication Sig Start Date End Date Taking? Authorizing Provider  azithromycin (ZITHROMAX) 250 MG tablet Take 4 tablets (1,000 mg total) by mouth once. 04/02/14   Marlis EdelsonKarim, Walidah N, CNM  cyclobenzaprine (FLEXERIL) 10 MG tablet Take 1 tablet (10 mg total) by mouth 2 (two) times daily as needed for muscle spasms. 04/08/17   Janne NapoleonNeese, Hope M, NP  diclofenac (VOLTAREN) 50 MG EC tablet Take 1 tablet (50 mg total) by mouth 2 (two) times daily. 04/08/17   Janne NapoleonNeese, Hope M, NP  ibuprofen (ADVIL,MOTRIN) 800 MG tablet Take 1 tablet (800 mg total) by mouth every 8 (eight) hours as needed for mild pain or moderate pain. 12/07/14   Trixie DredgeWest, Emily, PA-C  methocarbamol (ROBAXIN) 500 MG tablet Take 1-2 tablets (500-1,000 mg total) by mouth every 6 (six) hours as needed for muscle spasms (or pain). 12/07/14   Trixie DredgeWest, Emily, PA-C  methocarbamol (ROBAXIN) 500 MG tablet Take 2 tablets (1,000 mg total) by mouth 3 (three)  times daily. 10/19/16   Arby BarrettePfeiffer, Marcy, MD  naproxen (NAPROSYN) 500 MG tablet Take 1 tablet (500 mg total) by mouth 2 (two) times daily. 10/19/16   Arby BarrettePfeiffer, Marcy, MD  penicillin v potassium (VEETID) 500 MG tablet Take 1 tablet (500 mg total) by mouth 4 (four) times daily. 06/24/15   Roxy HorsemanBrowning, Robert, PA-C  sulfamethoxazole-trimethoprim (BACTRIM DS,SEPTRA DS) 800-160 MG per tablet Take 1 tablet by mouth 2 (two) times daily. 04/01/14   Dorathy KinsmanSmith, Virginia, CNM    Family History Family History  Problem Relation Age of Onset  . Hypertension Father   . Diabetes Father   . Diabetes Paternal Grandmother   . Hypertension Paternal Grandmother     Social History Social History   Tobacco Use  . Smoking status: Former Smoker    Packs/day: 0.10    Types: Cigarettes  . Smokeless tobacco: Never Used  Substance Use Topics  . Alcohol use: No  . Drug use: Yes    Types: Marijuana    Comment: 1 X per week. Last used yesterday     Allergies   Patient has no known allergies.   Review of Systems Review of Systems  Constitutional: Negative for chills and fever.  HENT: Negative.  Respiratory: Negative for shortness of breath.   Cardiovascular: Negative for chest pain.  Gastrointestinal: Negative for abdominal pain.  Genitourinary: Negative for difficulty urinating, dysuria, frequency and urgency.       No loss of control of bladder or bowels.   Musculoskeletal: Positive for back pain. Negative for neck pain.  Skin: Negative for wound.  Neurological: Negative for syncope and headaches.  Psychiatric/Behavioral: Negative for confusion.     Physical Exam Updated Vital Signs BP (!) 132/94 (BP Location: Right Arm)   Pulse 83   Temp 99.3 F (37.4 C) (Oral)   Resp 16   SpO2 100%   Physical Exam  Constitutional: She appears well-developed and well-nourished. No distress.  HENT:  Head: Normocephalic and atraumatic.  Nose: Nose normal.  Mouth/Throat: Uvula is midline and mucous membranes are  normal.  Eyes: EOM are normal.  Neck: Normal range of motion. Neck supple.  Cardiovascular: Normal rate and regular rhythm.  Pulmonary/Chest: Effort normal and breath sounds normal.  Abdominal: Soft. There is no tenderness.  Musculoskeletal: Normal range of motion.       Lumbar back: She exhibits tenderness, pain and spasm. She exhibits normal pulse.       Back:  Neurological: She is alert. She has normal strength. No sensory deficit. Gait normal.  Reflex Scores:      Bicep reflexes are 2+ on the right side and 2+ on the left side.      Brachioradialis reflexes are 2+ on the right side and 2+ on the left side.      Patellar reflexes are 2+ on the right side and 2+ on the left side. Straight leg raises without difficulty.   Skin: Skin is warm and dry.  Psychiatric: She has a normal mood and affect. Her behavior is normal.  Nursing note and vitals reviewed.    ED Treatments / Results  Labs (all labs ordered are listed, but only abnormal results are displayed) Labs Reviewed - No data to display  Radiology No results found.  Procedures Procedures (including critical care time)  Medications Ordered in ED Medications - No data to display   Initial Impression / Assessment and Plan / ED Course  I have reviewed the triage vital signs and the nursing notes. Patient with back pain.  No neurological deficits and normal neuro exam.  Patient can walk but states is painful.  No loss of bowel or bladder control.  No concern for cauda equina.  No fever, night sweats, weight loss, h/o cancer, IVDU.  RICE protocol and pain medicine indicated and discussed with patient.   Final Clinical Impressions(s) / ED Diagnoses   Final diagnoses:  Sciatica, left side    ED Discharge Orders        Ordered    cyclobenzaprine (FLEXERIL) 10 MG tablet  2 times daily PRN     04/08/17 0913    diclofenac (VOLTAREN) 50 MG EC tablet  2 times daily     04/08/17 0913       Kerrie Buffaloeese, Hope McKenneyM, NP 04/08/17  [id number]    Raeford RazorKohut, Stephen, MD 04/08/17 [id number]

## 2017-04-08 NOTE — Discharge Instructions (Signed)
Do not take the muscle relaxer if you are driving or working as it will make you sleepy.

## 2017-05-16 NOTE — L&D Delivery Note (Signed)
Delivery Note Angel Pineda is a 32 y.o. G3P2002 at [redacted]w[redacted]d admitted for IOL d/t GHTN.  Labor course: foley bulb, pitocin, AROM Head delivered by nurse as I entered room, body as I was gloving At 0544 a viable female was delivered via spontaneous vaginal delivery (Presentation:LOA).  Vigorous infant placed directly on mom's abdomen for bonding/skin-to-skin. Delayed cord clamping x , then cord clamped x 2, and cut by pt's family member.  APGAR: 8, 9; weight: pending at time of note.  40 units of pitocin diluted in 1000cc LR was infused rapidly IV per protocol. The placenta separated spontaneously and delivered via CCT and maternal pushing effort.  It was inspected and appears to be intact with a 3 VC.  Placenta/Cord with the following complications: majority of placenta delivered externally, with part still attached to anterior wall of uterus, unable to remove in one piece, came out in fragments, with fragments still attached to anterior wall I was unable to get. Placenta examined and not intact. Called Dr. Adrian Blackwater in who was unable to remove, so to OR for D&C .  Cord pH: not done  Intrapartum complications:  GHTN Anesthesia:  epidural Episiotomy: none Lacerations:  none Suture Repair: n/a Est. Blood Loss (mL): 1157 prior to OR Sponge and instrument count were correct x2.  Mom to OR.  Baby to Couplet care / Skin to Skin. Placenta to path. Plans to breast and bottlefeed Contraception: IUD Circ: n/a  Cheral Marker CNM, [facility] 02/21/2018 6:23 AM

## 2017-11-02 LAB — OB RESULTS CONSOLE RUBELLA ANTIBODY, IGM: Rubella: IMMUNE

## 2017-11-02 LAB — OB RESULTS CONSOLE HEPATITIS B SURFACE ANTIGEN: HEP B S AG: NEGATIVE

## 2017-11-02 LAB — OB RESULTS CONSOLE GC/CHLAMYDIA
CHLAMYDIA, DNA PROBE: NEGATIVE
GC PROBE AMP, GENITAL: NEGATIVE

## 2017-11-02 LAB — OB RESULTS CONSOLE RPR: RPR: NONREACTIVE

## 2017-12-07 LAB — OB RESULTS CONSOLE HIV ANTIBODY (ROUTINE TESTING): HIV: NONREACTIVE

## 2018-01-02 ENCOUNTER — Inpatient Hospital Stay (HOSPITAL_COMMUNITY)
Admission: AD | Admit: 2018-01-02 | Discharge: 2018-01-02 | Disposition: A | Discharge status: Home / Self Care | Payer: Medicaid Other | Source: Ambulatory Visit | Attending: Obstetrics and Gynecology | Admitting: Obstetrics and Gynecology

## 2018-01-02 ENCOUNTER — Encounter (HOSPITAL_COMMUNITY): Payer: Self-pay

## 2018-01-02 DIAGNOSIS — O368130 Decreased fetal movements, third trimester, not applicable or unspecified: Secondary | ICD-10-CM | POA: Insufficient documentation

## 2018-01-02 DIAGNOSIS — O2343 Unspecified infection of urinary tract in pregnancy, third trimester: Secondary | ICD-10-CM

## 2018-01-02 DIAGNOSIS — Z3A32 32 weeks gestation of pregnancy: Secondary | ICD-10-CM | POA: Diagnosis not present

## 2018-01-02 DIAGNOSIS — Z3689 Encounter for other specified antenatal screening: Secondary | ICD-10-CM

## 2018-01-02 DIAGNOSIS — Z87891 Personal history of nicotine dependence: Secondary | ICD-10-CM | POA: Diagnosis not present

## 2018-01-02 LAB — URINALYSIS, ROUTINE W REFLEX MICROSCOPIC
BACTERIA UA: NONE SEEN
Bilirubin Urine: NEGATIVE
Glucose, UA: NEGATIVE mg/dL
KETONES UR: NEGATIVE mg/dL
Nitrite: NEGATIVE
Protein, ur: 100 mg/dL — AB
RBC / HPF: >50 RBC/hpf — ABNORMAL HIGH (ref 0–5)
SPECIFIC GRAVITY, URINE: 1.019 (ref 1.005–1.030)
WBC, UA: >50 WBC/hpf — ABNORMAL HIGH (ref 0–5)
pH: 6 (ref 5.0–8.0)

## 2018-01-02 LAB — WET PREP, GENITAL
Clue Cells Wet Prep HPF POC: NONE SEEN
Sperm: NONE SEEN
TRICH WET PREP: NONE SEEN
Yeast Wet Prep HPF POC: NONE SEEN

## 2018-01-02 MED ORDER — CEPHALEXIN 500 MG PO CAPS: 500.0000 mg | ORAL_CAPSULE | Freq: Four times a day (QID) | ORAL | 0 refills | Status: AC

## 2018-01-02 NOTE — MAU Note (Signed)
Pt here for complaints of blood in urine. States it is in the toilet every time she goes to the bathroom. States it started today. Pt denies pain or burning with urination. Pt states that it is not vaginal bleeding. Pt does report some lower abdominal cramping that also started today. Pt states that she has not felt her baby move in 2 hours. FHTs 150 in triage and patient reports that she felt the baby kick after dopplering FHT.

## 2018-01-02 NOTE — MAU Provider Note (Addendum)
History     CSN: [id number]  Arrival date and time: 01/02/18 2004   First Provider Initiated Contact with Patient 01/02/18 2111      Chief Complaint  Patient presents with  . Hematuria  . Decreased Fetal Movement   G3P2002 @32 .0 wks here with blood in her urine. She has noticed it several times today, bright red in the toilet. She does not think it is VB. No urinary sx. No back or flank pain. Denies LOF or ctx. Reports good FM.   OB History    Gravida  3   Para  2   Term  2   Preterm      AB      Living  2     SAB      TAB      Ectopic      Multiple      Live Births              Past Medical History:  Diagnosis Date  . Chlamydia   . Trichomonas infection   . Urinary tract infection     Past Surgical History:  Procedure Laterality Date  . VAGINAL DELIVERY      Family History  Problem Relation Age of Onset  . Hypertension Father   . Diabetes Father   . Diabetes Paternal Grandmother   . Hypertension Paternal Grandmother     Social History   Tobacco Use  . Smoking status: Former Smoker    Packs/day: 0.10    Types: Cigarettes  . Smokeless tobacco: Never Used  Substance Use Topics  . Alcohol use: No    Frequency: Never  . Drug use: Not Currently    Types: Marijuana    Allergies: No Known Allergies  Medications Prior to Admission  Medication Sig Dispense Refill Last Dose  . azithromycin (ZITHROMAX) 250 MG tablet Take 4 tablets (1,000 mg total) by mouth once. 4 tablet 0   . cyclobenzaprine (FLEXERIL) 10 MG tablet Take 1 tablet (10 mg total) by mouth 2 (two) times daily as needed for muscle spasms. 20 tablet 0   . diclofenac (VOLTAREN) 50 MG EC tablet Take 1 tablet (50 mg total) by mouth 2 (two) times daily. 15 tablet 0   . ibuprofen (ADVIL,MOTRIN) 800 MG tablet Take 1 tablet (800 mg total) by mouth every 8 (eight) hours as needed for mild pain or moderate pain. 15 tablet 0 06/23/2015 at Unknown time  . methocarbamol (ROBAXIN) 500 MG tablet  Take 1-2 tablets (500-1,000 mg total) by mouth every 6 (six) hours as needed for muscle spasms (or pain). 30 tablet 0   . methocarbamol (ROBAXIN) 500 MG tablet Take 2 tablets (1,000 mg total) by mouth 3 (three) times daily. 30 tablet 0   . naproxen (NAPROSYN) 500 MG tablet Take 1 tablet (500 mg total) by mouth 2 (two) times daily. 30 tablet 0   . penicillin v potassium (VEETID) 500 MG tablet Take 1 tablet (500 mg total) by mouth 4 (four) times daily. 40 tablet 0   . sulfamethoxazole-trimethoprim (BACTRIM DS,SEPTRA DS) 800-160 MG per tablet Take 1 tablet by mouth 2 (two) times daily. 14 tablet 0     Review of Systems  Constitutional: Negative for chills and fever.  Gastrointestinal: Negative for abdominal pain.  Genitourinary: Positive for hematuria. Negative for dysuria, urgency, vaginal bleeding and vaginal discharge.  Musculoskeletal: Negative for back pain.   Physical Exam   Blood pressure 116/69, pulse 79, temperature 98.4 F (36.9 C), resp.  rate 18, height 5\' 3"  (1.6 m), weight 114.8 kg, SpO2 99 %.  Physical Exam  Nursing note and vitals reviewed. Constitutional: She is oriented to person, place, and time. She appears well-developed and well-nourished. No distress.  HENT:  Head: Normocephalic and atraumatic.  Neck: Normal range of motion.  Cardiovascular: Normal rate.  Respiratory: Effort normal. No respiratory distress.  GI: Soft. She exhibits no distension. There is no tenderness.  gravid  Genitourinary:  Genitourinary Comments: External: no lesions or erythema Vagina: rugated, pink, moist, thin white discharge, no blood Cervix closed/thick   Musculoskeletal: Normal range of motion.  Neurological: She is alert and oriented to person, place, and time.  Skin: Skin is warm and dry.  Psychiatric: She has a normal mood and affect.  EFM: 150 bpm, mod variability, + accels, no decels Toco: none  Results for orders placed or performed during the hospital encounter of 01/02/18  (from the past 24 hour(s))  Urinalysis, Routine w reflex microscopic     Status: Abnormal   Collection Time: 01/02/18  8:34 PM  Result Value Ref Range   Color, Urine AMBER (A) YELLOW   APPearance CLOUDY (A) CLEAR   Specific Gravity, Urine 1.019 1.005 - 1.030   pH 6.0 5.0 - 8.0   Glucose, UA NEGATIVE NEGATIVE mg/dL   Hgb urine dipstick LARGE (A) NEGATIVE   Bilirubin Urine NEGATIVE NEGATIVE   Ketones, ur NEGATIVE NEGATIVE mg/dL   Protein, ur 161100 (A) NEGATIVE mg/dL   Nitrite NEGATIVE NEGATIVE   Leukocytes, UA TRACE (A) NEGATIVE   RBC / HPF >50 (H) 0 - 5 RBC/hpf   WBC, UA >50 (H) 0 - 5 WBC/hpf   Bacteria, UA NONE SEEN NONE SEEN   Mucus PRESENT   Wet prep, genital     Status: Abnormal   Collection Time: 01/02/18  9:17 PM  Result Value Ref Range   Yeast Wet Prep HPF POC NONE SEEN NONE SEEN   Trich, Wet Prep NONE SEEN NONE SEEN   Clue Cells Wet Prep HPF POC NONE SEEN NONE SEEN   WBC, Wet Prep HPF POC FEW (A) NONE SEEN   Sperm NONE SEEN    MAU Course  Procedures  MDM Labs ordered and reviewed. Hbg and WBCs noted on UA, will treat for presumed UTI. UC sent. No evidence of VB or PTL. Stable for discharge home.  Assessment and Plan   1. [redacted] weeks gestation of pregnancy   2. NST (non-stress test) reactive   3. Urinary tract infection in mother during third trimester of pregnancy    [facility] scheduled Rx Keflex Hydrate  Allergies as of 01/02/2018   No Known Allergies     Medication List    STOP taking these medications   azithromycin 250 MG tablet Commonly known as:  ZITHROMAX   cyclobenzaprine 10 MG tablet Commonly known as:  FLEXERIL   diclofenac 50 MG EC tablet Commonly known as:  VOLTAREN   ibuprofen 800 MG tablet Commonly known as:  ADVIL,MOTRIN   methocarbamol 500 MG tablet Commonly known as:  ROBAXIN   naproxen 500 MG tablet Commonly known as:  NAPROSYN   penicillin v potassium 500 MG tablet Commonly known as:   VEETID   sulfamethoxazole-trimethoprim 800-160 MG tablet Commonly known as:  BACTRIM DS,SEPTRA DS     TAKE these medications   cephALEXin 500 MG capsule Commonly known as:  KEFLEX Take 1 capsule (500 mg total) by mouth 4 (four) times daily  for 7 days.       Donette LarryMelanie Zaedyn Covin, CNM 01/02/2018, 9:11 PM

## 2018-01-02 NOTE — Discharge Instructions (Signed)

## 2018-01-03 LAB — GC/CHLAMYDIA PROBE AMP (~~LOC~~) NOT AT ARMC
CHLAMYDIA, DNA PROBE: NEGATIVE
NEISSERIA GONORRHEA: NEGATIVE

## 2018-01-05 LAB — CULTURE, OB URINE: Culture: >=100000 — AB

## 2018-01-31 LAB — OB RESULTS CONSOLE GC/CHLAMYDIA
Chlamydia: NEGATIVE
Gonorrhea: NEGATIVE

## 2018-01-31 LAB — OB RESULTS CONSOLE GBS: GBS: NEGATIVE

## 2018-02-20 ENCOUNTER — Inpatient Hospital Stay (HOSPITAL_COMMUNITY): Payer: Medicaid Other | Admitting: Anesthesiology

## 2018-02-20 ENCOUNTER — Other Ambulatory Visit: Payer: Self-pay

## 2018-02-20 ENCOUNTER — Inpatient Hospital Stay (HOSPITAL_COMMUNITY)
Admission: AD | Admit: 2018-02-20 | Discharge: 2018-02-23 | DRG: 798 | Disposition: A | Discharge status: Home / Self Care | Payer: Medicaid Other | Attending: Family Medicine | Admitting: Family Medicine

## 2018-02-20 ENCOUNTER — Encounter (HOSPITAL_COMMUNITY): Payer: Self-pay

## 2018-02-20 DIAGNOSIS — Z23 Encounter for immunization: Secondary | ICD-10-CM

## 2018-02-20 DIAGNOSIS — Z3A39 39 weeks gestation of pregnancy: Secondary | ICD-10-CM | POA: Diagnosis not present

## 2018-02-20 DIAGNOSIS — O134 Gestational [pregnancy-induced] hypertension without significant proteinuria, complicating childbirth: Secondary | ICD-10-CM | POA: Diagnosis present

## 2018-02-20 DIAGNOSIS — Z87891 Personal history of nicotine dependence: Secondary | ICD-10-CM | POA: Diagnosis not present

## 2018-02-20 DIAGNOSIS — O139 Gestational [pregnancy-induced] hypertension without significant proteinuria, unspecified trimester: Secondary | ICD-10-CM | POA: Diagnosis present

## 2018-02-20 LAB — RAPID URINE DRUG SCREEN, HOSP PERFORMED
AMPHETAMINES: NOT DETECTED
BENZODIAZEPINES: NOT DETECTED
Barbiturates: NOT DETECTED
COCAINE: NOT DETECTED
Opiates: NOT DETECTED
Tetrahydrocannabinol: NOT DETECTED

## 2018-02-20 LAB — CBC
HCT: 38.3 % (ref 36.0–46.0)
HCT: 36 % (ref 36.0–46.0)
Hemoglobin: 13.1 g/dL (ref 12.0–15.0)
Hemoglobin: 12.2 g/dL (ref 12.0–15.0)
MCH: 30.7 pg (ref 26.0–34.0)
MCH: 30.7 pg (ref 26.0–34.0)
MCHC: 34.2 g/dL (ref 30.0–36.0)
MCHC: 33.9 g/dL (ref 30.0–36.0)
MCV: 89.7 fL (ref 80.0–100.0)
MCV: 90.7 fL (ref 80.0–100.0)
NRBC: 0 % (ref 0.0–0.2)
Platelets: 264 10*3/uL (ref 150–400)
Platelets: 240 10*3/uL (ref 150–400)
RBC: 4.27 MIL/uL (ref 3.87–5.11)
RBC: 3.97 MIL/uL (ref 3.87–5.11)
RDW: 13 % (ref 11.5–15.5)
RDW: 12.9 % (ref 11.5–15.5)
WBC: 10.5 10*3/uL (ref 4.0–10.5)
WBC: 10 10*3/uL (ref 4.0–10.5)

## 2018-02-20 LAB — COMPREHENSIVE METABOLIC PANEL
ALT: 23 U/L (ref 0–44)
AST: 24 U/L (ref 15–41)
Albumin: 3.4 g/dL — ABNORMAL LOW (ref 3.5–5.0)
Alkaline Phosphatase: 196 U/L — ABNORMAL HIGH (ref 38–126)
Anion gap: 10 (ref 5–15)
BILIRUBIN TOTAL: 0.5 mg/dL (ref 0.3–1.2)
BUN: 7 mg/dL (ref 6–20)
CHLORIDE: 105 mmol/L (ref 98–111)
CO2: 20 mmol/L — ABNORMAL LOW (ref 22–32)
Calcium: 9.1 mg/dL (ref 8.9–10.3)
Creatinine, Ser: 0.61 mg/dL (ref 0.44–1.00)
GFR calc Af Amer: >60 mL/min (ref >60)
Glucose, Bld: 67 mg/dL — ABNORMAL LOW (ref 70–99)
POTASSIUM: 4.3 mmol/L (ref 3.5–5.1)
Sodium: 135 mmol/L (ref 135–145)
TOTAL PROTEIN: 7.1 g/dL (ref 6.5–8.1)

## 2018-02-20 LAB — PROTEIN / CREATININE RATIO, URINE
Creatinine, Urine: 57 mg/dL
Total Protein, Urine: <6 mg/dL

## 2018-02-20 MED ORDER — EPHEDRINE 5 MG/ML INJ: 10.0000 mg | INTRAVENOUS | Status: DC | PRN

## 2018-02-20 MED ORDER — OXYCODONE-ACETAMINOPHEN 5-325 MG PO TABS: 2.0000 | ORAL_TABLET | ORAL | Status: DC | PRN

## 2018-02-20 MED ORDER — ACETAMINOPHEN 325 MG PO TABS
650.0000 mg | ORAL_TABLET | ORAL | Status: DC | PRN
  Administered 2018-02-20: 650 mg via ORAL
  Filled 2018-02-20: qty 2

## 2018-02-20 MED ORDER — OXYTOCIN 40 UNITS IN LACTATED RINGERS INFUSION - SIMPLE MED: 1.0000 m[IU]/min | INTRAVENOUS | Status: DC

## 2018-02-20 MED ORDER — DIPHENHYDRAMINE HCL 50 MG/ML IJ SOLN: 12.5000 mg | INTRAMUSCULAR | Status: DC | PRN

## 2018-02-20 MED ORDER — LACTATED RINGERS IV SOLN
500.0000 mL | Freq: Once | INTRAVENOUS | Status: AC
  Administered 2018-02-20: 500 mL via INTRAVENOUS

## 2018-02-20 MED ORDER — LACTATED RINGERS IV SOLN: 500.0000 mL | INTRAVENOUS | Status: DC | PRN

## 2018-02-20 MED ORDER — OXYCODONE-ACETAMINOPHEN 5-325 MG PO TABS: 1.0000 | ORAL_TABLET | ORAL | Status: DC | PRN

## 2018-02-20 MED ORDER — OXYTOCIN 40 UNITS IN LACTATED RINGERS INFUSION - SIMPLE MED
2.5000 [IU]/h | INTRAVENOUS | Status: DC
  Administered 2018-02-21: 10 [IU]/h via INTRAVENOUS

## 2018-02-20 MED ORDER — PHENYLEPHRINE 40 MCG/ML (10ML) SYRINGE FOR IV PUSH (FOR BLOOD PRESSURE SUPPORT)
80.0000 ug | PREFILLED_SYRINGE | INTRAVENOUS | Status: DC | PRN
  Filled 2018-02-20: qty 10

## 2018-02-20 MED ORDER — OXYTOCIN 40 UNITS IN LACTATED RINGERS INFUSION - SIMPLE MED
1.0000 m[IU]/min | INTRAVENOUS | Status: DC
  Administered 2018-02-20: 1 m[IU]/min via INTRAVENOUS
  Filled 2018-02-20: qty 1000

## 2018-02-20 MED ORDER — SOD CITRATE-CITRIC ACID 500-334 MG/5ML PO SOLN
30.0000 mL | ORAL | Status: DC | PRN
  Administered 2018-02-21: 30 mL via ORAL
  Filled 2018-02-20: qty 15

## 2018-02-20 MED ORDER — FENTANYL 2.5 MCG/ML BUPIVACAINE 1/10 % EPIDURAL INFUSION (WH - ANES)
14.0000 mL/h | INTRAMUSCULAR | Status: DC | PRN
  Administered 2018-02-20: 14 mL/h via EPIDURAL
  Filled 2018-02-20: qty 100

## 2018-02-20 MED ORDER — OXYTOCIN BOLUS FROM INFUSION: 500.0000 mL | Freq: Once | INTRAVENOUS | Status: DC

## 2018-02-20 MED ORDER — FENTANYL CITRATE (PF) 100 MCG/2ML IJ SOLN
100.0000 ug | INTRAMUSCULAR | Status: DC | PRN
  Administered 2018-02-21: 50 ug via INTRAVENOUS
  Administered 2018-02-21 (×2): 25 ug via INTRAVENOUS

## 2018-02-20 MED ORDER — TERBUTALINE SULFATE 1 MG/ML IJ SOLN: 0.2500 mg | Freq: Once | INTRAMUSCULAR | Status: DC | PRN

## 2018-02-20 MED ORDER — PHENYLEPHRINE 40 MCG/ML (10ML) SYRINGE FOR IV PUSH (FOR BLOOD PRESSURE SUPPORT): 80.0000 ug | PREFILLED_SYRINGE | INTRAVENOUS | Status: DC | PRN

## 2018-02-20 MED ORDER — LIDOCAINE HCL (PF) 1 % IJ SOLN
INTRAMUSCULAR | Status: DC | PRN
  Administered 2018-02-20: 7 mL via EPIDURAL
  Administered 2018-02-20: 6 mL via EPIDURAL

## 2018-02-20 MED ORDER — LACTATED RINGERS IV SOLN
INTRAVENOUS | Status: DC
  Administered 2018-02-20 – 2018-02-21 (×3): via INTRAVENOUS

## 2018-02-20 MED ORDER — ONDANSETRON HCL 4 MG/2ML IJ SOLN
4.0000 mg | Freq: Four times a day (QID) | INTRAMUSCULAR | Status: DC | PRN
  Administered 2018-02-21: 4 mg via INTRAVENOUS

## 2018-02-20 MED ORDER — LIDOCAINE HCL (PF) 1 % IJ SOLN
30.0000 mL | INTRAMUSCULAR | Status: DC | PRN
  Filled 2018-02-20: qty 30

## 2018-02-20 NOTE — Anesthesia Pain Management Evaluation Note (Signed)
  [facility] Pain Management Visit Note  Patient: Angel Pineda, 32 y.o., female  "Hello I am a member of the anesthesia team at [facility]. We have an anesthesia team available at all times to provide care throughout the hospital, including epidural management and anesthesia for C-section. I don't know your plan for the delivery whether it a natural birth, water birth, IV sedation, nitrous supplementation, doula or epidural, but we want to meet your pain goals."   1.Was your pain managed to your expectations on prior hospitalizations?   Yes   2.What is your expectation for pain management during this hospitalization?     Epidural  3.How can we help you reach that goal? epidural  Record the patient's initial score and the patient's pain goal.   Pain: 6  Pain Goal: 6 [facility] wants you to be able to say your pain was always managed very well.  Rockie Vawter 02/20/2018

## 2018-02-20 NOTE — Anesthesia Preprocedure Evaluation (Signed)
Anesthesia Evaluation  Patient identified by MRN, date of birth, ID band Patient awake    Reviewed: Allergy & Precautions, H&P , NPO status , Patient's Chart, lab work & pertinent test results  Airway Mallampati: III  TM Distance: >3 FB Neck ROM: full    Dental no notable dental hx. (+) Teeth Intact   Pulmonary former smoker,    Pulmonary exam normal breath sounds clear to auscultation       Cardiovascular hypertension, negative cardio ROS Normal cardiovascular exam Rhythm:regular Rate:Normal     Neuro/Psych negative neurological ROS  negative psych ROS   GI/Hepatic negative GI ROS, Neg liver ROS,   Endo/Other  Morbid obesity  Renal/GU negative Renal ROS  negative genitourinary   Musculoskeletal negative musculoskeletal ROS (+)   Abdominal (+) + obese,   Peds  Hematology negative hematology ROS (+)   Anesthesia Other Findings   Reproductive/Obstetrics (+) Pregnancy                             Anesthesia Physical Anesthesia Plan  ASA: III  Anesthesia Plan: Epidural   Post-op Pain Management:    Induction:   PONV Risk Score and Plan:   Airway Management Planned:   Additional Equipment:   Intra-op Plan:   Post-operative Plan:   Informed Consent: I have reviewed the patients History and Physical, chart, labs and discussed the procedure including the risks, benefits and alternatives for the proposed anesthesia with the patient or authorized representative who has indicated his/her understanding and acceptance.     Plan Discussed with:   Anesthesia Plan Comments:         Anesthesia Quick Evaluation

## 2018-02-20 NOTE — Progress Notes (Signed)
Patient ID: Angel Pineda, female   DOB: [birth year], 32 y.o.   MRN: [id number] Angel Pineda is a 33 y.o. G3P2002 at [redacted]w[redacted]d admitted for induction of labor due to [facility].  Subjective: Getting uncomfortable w/ uc's. Denies ha, visual changes, ruq/epigastric pain, n/v.    Objective: BP 130/85   Pulse 61   Temp 98.5 F (36.9 C) (Axillary)   Resp 16   Ht 5\' 2"  (1.575 m)   Wt 120.7 kg   SpO2 100%   BMI 48.65 kg/m  No intake/output data recorded.  FHT:  FHR: 150 bpm, variability: moderate,  accelerations:  Present,  decelerations:  Absent UC:   irregular, every 2-7 minutes  SVE:   Dilation: 5.5 Effacement (%): 70 Station: -2, -3 Exam by:: Joellyn Haff, CNM  Pitocin @ 5 mu/min  Labs: Lab Results  Component Value Date   WBC 10.5 02/20/2018   HGB 13.1 02/20/2018   HCT 38.3 02/20/2018   MCV 89.7 02/20/2018   PLT 264 02/20/2018    Assessment / Plan: IOL d/t GHTN, foley bulb out at 1840 per nurse, increase pitocin 2x2now (was 1x1 while foley in ) to acheive adequate labor  Labor: s/p cervical ripening Fetal Wellbeing:  Category I Pain Control:  thinking about epidural Pre-eclampsia: labs normal, bp's stable I/D:  n/a Anticipated MOD:  NSVD  Cheral Marker CNM, WHNP-BC 02/20/2018, 9:47 PM

## 2018-02-20 NOTE — Anesthesia Procedure Notes (Signed)
Epidural Patient location during procedure: OB Start time: 02/20/2018 11:33 PM End time: 02/20/2018 11:37 PM  Staffing Anesthesiologist: Leilani Able, MD Performed: anesthesiologist   Preanesthetic Checklist Completed: patient identified, site marked, surgical consent, pre-op evaluation, timeout performed, IV checked, risks and benefits discussed and monitors and equipment checked  Epidural Patient position: sitting Prep: site prepped and draped and DuraPrep Patient monitoring: continuous pulse ox and blood pressure Approach: midline Location: L3-L4 Injection technique: LOR air  Needle:  Needle type: Tuohy  Needle gauge: 17 G Needle length: 9 cm and 9 Needle insertion depth: 8 cm Catheter type: closed end flexible Catheter size: 19 Gauge Catheter at skin depth: 13 cm Test dose: negative and Other  Assessment Sensory level: T9 Events: blood not aspirated, injection not painful, no injection resistance, negative IV test and no paresthesia  Additional Notes Reason for block:procedure for pain

## 2018-02-20 NOTE — H&P (Addendum)
LABOR AND DELIVERY ADMISSION HISTORY AND PHYSICAL NOTE  Angel Pineda is a 32 y.o. female G75P2002 with IUP at [redacted]w[redacted]d by LMP presenting for IOL for GHTN. Reports she has a mild HA, denies any visual changes, lightheadedness, or dizziness. She reports positive fetal movement. She denies leakage of fluid or vaginal bleeding.  Prenatal History/Complications: [facility]  Pregnancy complications: GHTN, history of marijuana and tobacco use (+ UDS during pregnancy), and positive anti-Big E   Past Medical History: Past Medical History:  Diagnosis Date  . Chlamydia   . Trichomonas infection   . Urinary tract infection     Past Surgical History: Past Surgical History:  Procedure Laterality Date  . VAGINAL DELIVERY      Obstetrical History: OB History    Gravida  3   Para  2   Term  2   Preterm      AB      Living  2     SAB      TAB      Ectopic      Multiple      Live Births  2           Social History: Social History   Socioeconomic History  . Marital status: Single    Spouse name: Not on file  . Number of children: 2  . Years of education: Not on file  . Highest education level: High school graduate  Occupational History  . Not on file  Social Needs  . Financial resource strain: Not on file  . Food insecurity:    Worry: Not on file    Inability: Not on file  . Transportation needs:    Medical: No    Non-medical: No  Tobacco Use  . Smoking status: Former Smoker    Packs/day: 0.10    Types: Cigarettes  . Smokeless tobacco: Never Used  Substance and Sexual Activity  . Alcohol use: No    Frequency: Never  . Drug use: Not Currently    Types: Marijuana  . Sexual activity: Not Currently    Birth control/protection: None  Lifestyle  . Physical activity:    Days per week: 0 days    Minutes per session: 0 min  . Stress: Not on file  Relationships  . Social connections:    Talks on phone: More than three times a week    Gets together:  More than three times a week    Attends religious service: More than 4 times per year    Active member of club or organization: No    Attends meetings of clubs or organizations: Never    Relationship status: Never married  Other Topics Concern  . Not on file  Social History Narrative  . Not on file    Family History: Family History  Problem Relation Age of Onset  . Hypertension Father   . Diabetes Father   . Diabetes Paternal Grandmother   . Hypertension Paternal Grandmother     Allergies: No Known Allergies  Medications Prior to Admission  Medication Sig Dispense Refill Last Dose  . acetaminophen (TYLENOL) 325 MG tablet Take 650 mg by mouth every 6 (six) hours as needed.   Past Month at Unknown time  . Prenatal Vit-Fe Fumarate-FA (PRENATAL MULTIVITAMIN) TABS tablet Take 1 tablet by mouth daily at 12 noon.   02/20/2018 at Unknown time     Review of Systems  All systems reviewed and negative except as stated in HPI  Physical Exam Blood pressure (!) 142/80, pulse 70, temperature 98.5 F (36.9 C), temperature source Axillary, resp. rate 16, height 5\' 2"  (1.575 m), weight 120.7 kg. General appearance: alert, oriented, NAD Lungs: normal respiratory effort Heart: regular rate Abdomen: soft, non-tender; gravid, FH appropriate for GA Extremities: No calf swelling or tenderness Presentation: cephalic  Fetal monitoring: Baseline 140's, mod variability, + accels, - decels  Uterine activity: Irregular, irritable  Dilation: 2 Effacement (%): 50 Station: Ballotable Exam by:: Loletha Carrow, RN  Prenatal labs: ABO, Rh: --/--/O POS (10/08 1500) Antibody: POS (10/08 1500) Positive Anti-Big E  Rubella: Immune (06/20 0000) RPR: Nonreactive (06/20 0000)  HBsAg:   Negative  HIV: Non-reactive (07/25 0000)  GC/Chlamydia: Negative  GBS: Negative (09/18 0000)  1-hr GTT: Normal  Genetic screening:  Not done  Anatomy US: Normal   Prenatal Transfer Tool  Maternal Diabetes: No Genetic  Screening: Not done  Maternal Ultrasounds/Referrals: Normal Fetal Ultrasounds or other Referrals:  None Maternal Substance Abuse:  Yes:  Type: Smoker, Marijuana, Positive UDS during pregnancy.  Significant Maternal Medications:  None Significant Maternal Lab Results: Lab values include: Group B Strep negative  Results for orders placed or performed during the hospital encounter of 02/20/18 (from the past 24 hour(s))  Protein / creatinine ratio, urine   Collection Time: 02/20/18  2:15 PM  Result Value Ref Range   Creatinine, Urine 57.00 mg/dL   Total Protein, Urine <6 mg/dL   Protein Creatinine Ratio        0.00 - 0.15 mg/mg[Cre]  Urine rapid drug screen (hosp performed)   Collection Time: 02/20/18  2:15 PM  Result Value Ref Range   Opiates NONE DETECTED NONE DETECTED   Cocaine NONE DETECTED NONE DETECTED   Benzodiazepines NONE DETECTED NONE DETECTED   Amphetamines NONE DETECTED NONE DETECTED   Tetrahydrocannabinol NONE DETECTED NONE DETECTED   Barbiturates NONE DETECTED NONE DETECTED  CBC   Collection Time: 02/20/18  3:00 PM  Result Value Ref Range   WBC 10.5 4.0 - 10.5 K/uL   RBC 4.27 3.87 - 5.11 MIL/uL   Hemoglobin 13.1 12.0 - 15.0 g/dL   HCT 16.1 09.6 - 04.5 %   MCV 89.7 80.0 - 100.0 fL   MCH 30.7 26.0 - 34.0 pg   MCHC 34.2 30.0 - 36.0 g/dL   RDW 40.9 81.1 - 91.4 %   Platelets 264 150 - 400 K/uL  Type and screen [facility]   Collection Time: 02/20/18  3:00 PM  Result Value Ref Range   ABO/RH(D) O POS    Antibody Screen POS    Sample Expiration      02/23/2018 Performed at [facility], [address] [place], [place] [address]    Antibody Identification PENDING    PT AG Type PENDING    Unit Number [id number]    Blood Component Type RED CELLS,LR    Unit division 00    Status of Unit ALLOCATED    Transfusion Status OK TO TRANSFUSE    Crossmatch Result COMPATIBLE    Donor AG Type NEGATIVE FOR E ANTIGEN    Unit Number [id number]     Blood Component Type RED CELLS,LR    Unit division 00    Status of Unit ALLOCATED    Transfusion Status OK TO TRANSFUSE    Crossmatch Result COMPATIBLE    Donor AG Type NEGATIVE FOR E ANTIGEN   BPAM RBC   Collection Time: 02/20/18  3:00 PM  Result Value Ref Range  Blood Product Unit Number [id number]    Unit Type and Rh 5100    Blood Product Expiration Date 119147829562    Blood Product Unit Number [id number]    Unit Type and Rh 5100    Blood Product Expiration Date [id number]     Patient Active Problem List   Diagnosis Date Noted  . Chronic hypertension affecting pregnancy 02/20/2018    Assessment: Adaisha Campise Ramaswamy is a 31 y.o. G3P2002 at [redacted]w[redacted]d here for IOL for gestational hypertension. Pregnancy complicated by GHTN, positive Anti-Big E, and history of marijuana and tobacco use (+ UDS during pregnancy). GBS negative.     #Labor: IOL, placed foley bulb. Start low dose pit. Serial cervical checks.   #Pain: Epidural  #FWB: Cat 1 strip, confirmed cephalic by U/S  #ID: GBS negative  #MOF: Breast and bottle feeding  #MOC: IUD  #Circ: N/A  1. Gestational Hypertension: Stable. No medications used at home for control, noted for first time in the office today. BP ranging 140's systolic, no severe BP's yet.   -Monitor BP, Maintain SBP <160, <110. Labetalol IV if needed.   -CMP and Urine Protein/Cr Ratio  2. History of marijuana and tobacco use:   -UDS  Allayne Stack  Family Medicine PGY-1  02/20/2018, 6:30 PM   OB FELLOW HISTORY AND PHYSICAL ATTESTATION  I have seen and examined this patient; I agree with above documentation in the resident's note.   Marcy Siren, D.O. OB Fellow  02/20/2018, 7:39 PM

## 2018-02-21 ENCOUNTER — Inpatient Hospital Stay (HOSPITAL_COMMUNITY): Payer: Medicaid Other | Admitting: Anesthesiology

## 2018-02-21 ENCOUNTER — Encounter (HOSPITAL_COMMUNITY)
Admission: AD | Disposition: A | Discharge status: Home / Self Care | Payer: Self-pay | Source: Home / Self Care | Attending: Family Medicine

## 2018-02-21 ENCOUNTER — Inpatient Hospital Stay (HOSPITAL_COMMUNITY): Payer: Medicaid Other

## 2018-02-21 ENCOUNTER — Encounter (HOSPITAL_COMMUNITY): Payer: Self-pay

## 2018-02-21 HISTORY — PX: DILATION AND EVACUATION: SHX1459

## 2018-02-21 LAB — CBC
HCT: 28.8 % — ABNORMAL LOW (ref 36.0–46.0)
HEMATOCRIT: 28 % — AB (ref 36.0–46.0)
HEMOGLOBIN: 10.2 g/dL — AB (ref 12.0–15.0)
HEMOGLOBIN: 9.7 g/dL — AB (ref 12.0–15.0)
MCH: 31.9 pg (ref 26.0–34.0)
MCH: 31.6 pg (ref 26.0–34.0)
MCHC: 35.4 g/dL (ref 30.0–36.0)
MCHC: 34.6 g/dL (ref 30.0–36.0)
MCV: 90 fL (ref 80.0–100.0)
MCV: 91.2 fL (ref 80.0–100.0)
NRBC: 0 % (ref 0.0–0.2)
Platelets: 203 10*3/uL (ref 150–400)
Platelets: 242 10*3/uL (ref 150–400)
RBC: 3.2 MIL/uL — ABNORMAL LOW (ref 3.87–5.11)
RBC: 3.07 MIL/uL — ABNORMAL LOW (ref 3.87–5.11)
RDW: 12.9 % (ref 11.5–15.5)
RDW: 13.1 % (ref 11.5–15.5)
WBC: 15.5 10*3/uL — ABNORMAL HIGH (ref 4.0–10.5)
WBC: 12.6 10*3/uL — AB (ref 4.0–10.5)
nRBC: 0 % (ref 0.0–0.2)

## 2018-02-21 LAB — RPR: RPR Ser Ql: NONREACTIVE

## 2018-02-21 SURGERY — DILATION AND EVACUATION, UTERUS
Anesthesia: Epidural

## 2018-02-21 MED ORDER — SIMETHICONE 80 MG PO CHEW: 80.0000 mg | CHEWABLE_TABLET | ORAL | Status: DC | PRN

## 2018-02-21 MED ORDER — ONDANSETRON HCL 4 MG/2ML IJ SOLN
INTRAMUSCULAR | Status: AC
  Filled 2018-02-21: qty 2

## 2018-02-21 MED ORDER — DIBUCAINE 1 % RE OINT: 1.0000 "application " | TOPICAL_OINTMENT | RECTAL | Status: DC | PRN

## 2018-02-21 MED ORDER — METHYLERGONOVINE MALEATE 0.2 MG/ML IJ SOLN
INTRAMUSCULAR | Status: AC
  Filled 2018-02-21: qty 1

## 2018-02-21 MED ORDER — PRENATAL MULTIVITAMIN CH
1.0000 | ORAL_TABLET | Freq: Every day | ORAL | Status: DC
  Administered 2018-02-21 – 2018-02-23 (×3): 1 via ORAL
  Filled 2018-02-21 (×3): qty 1

## 2018-02-21 MED ORDER — COCONUT OIL OIL: 1.0000 "application " | TOPICAL_OIL | Status: DC | PRN

## 2018-02-21 MED ORDER — ACETAMINOPHEN 10 MG/ML IV SOLN: 1000.0000 mg | Freq: Once | INTRAVENOUS | Status: DC | PRN

## 2018-02-21 MED ORDER — FENTANYL CITRATE (PF) 100 MCG/2ML IJ SOLN
INTRAMUSCULAR | Status: AC
  Filled 2018-02-21: qty 2

## 2018-02-21 MED ORDER — GENTAMICIN SULFATE 40 MG/ML IJ SOLN
INTRAVENOUS | Status: AC
  Administered 2018-02-21: 117.45 mg via INTRAVENOUS
  Filled 2018-02-21: qty 9.75

## 2018-02-21 MED ORDER — BENZOCAINE-MENTHOL 20-0.5 % EX AERO: 1.0000 "application " | INHALATION_SPRAY | CUTANEOUS | Status: DC | PRN

## 2018-02-21 MED ORDER — LACTATED RINGERS IV SOLN
INTRAVENOUS | Status: DC | PRN
  Administered 2018-02-21 (×2): via INTRAVENOUS

## 2018-02-21 MED ORDER — INFLUENZA VAC SPLIT QUAD 0.5 ML IM SUSY
0.5000 mL | PREFILLED_SYRINGE | INTRAMUSCULAR | Status: AC
  Administered 2018-02-22: 0.5 mL via INTRAMUSCULAR
  Filled 2018-02-21: qty 0.5

## 2018-02-21 MED ORDER — EPHEDRINE 5 MG/ML INJ
INTRAVENOUS | Status: AC
  Filled 2018-02-21: qty 10

## 2018-02-21 MED ORDER — OXYCODONE-ACETAMINOPHEN 5-325 MG PO TABS: 2.0000 | ORAL_TABLET | ORAL | Status: DC | PRN

## 2018-02-21 MED ORDER — HYDROMORPHONE HCL 1 MG/ML IJ SOLN: 0.2500 mg | INTRAMUSCULAR | Status: DC | PRN

## 2018-02-21 MED ORDER — TETANUS-DIPHTH-ACELL PERTUSSIS 5-2.5-18.5 LF-MCG/0.5 IM SUSP: 0.5000 mL | Freq: Once | INTRAMUSCULAR | Status: DC

## 2018-02-21 MED ORDER — SENNOSIDES-DOCUSATE SODIUM 8.6-50 MG PO TABS
2.0000 | ORAL_TABLET | ORAL | Status: DC
  Administered 2018-02-21 – 2018-02-22 (×2): 2 via ORAL
  Filled 2018-02-21 (×2): qty 2

## 2018-02-21 MED ORDER — ZOLPIDEM TARTRATE 5 MG PO TABS: 5.0000 mg | ORAL_TABLET | Freq: Every evening | ORAL | Status: DC | PRN

## 2018-02-21 MED ORDER — WITCH HAZEL-GLYCERIN EX PADS: 1.0000 "application " | MEDICATED_PAD | CUTANEOUS | Status: DC | PRN

## 2018-02-21 MED ORDER — METHYLERGONOVINE MALEATE 0.2 MG/ML IJ SOLN
INTRAMUSCULAR | Status: DC | PRN
  Administered 2018-02-21: 0.2 mg via INTRAMUSCULAR

## 2018-02-21 MED ORDER — LIDOCAINE-EPINEPHRINE (PF) 2 %-1:200000 IJ SOLN
INTRAMUSCULAR | Status: AC
  Filled 2018-02-21: qty 20

## 2018-02-21 MED ORDER — ACETAMINOPHEN 325 MG PO TABS
650.0000 mg | ORAL_TABLET | ORAL | Status: DC | PRN
  Administered 2018-02-23 (×2): 650 mg via ORAL
  Filled 2018-02-21 (×2): qty 2

## 2018-02-21 MED ORDER — MEPERIDINE HCL 25 MG/ML IJ SOLN: 6.2500 mg | INTRAMUSCULAR | Status: DC | PRN

## 2018-02-21 MED ORDER — PROMETHAZINE HCL 25 MG/ML IJ SOLN: 6.2500 mg | INTRAMUSCULAR | Status: DC | PRN

## 2018-02-21 MED ORDER — DIPHENHYDRAMINE HCL 25 MG PO CAPS: 25.0000 mg | ORAL_CAPSULE | Freq: Four times a day (QID) | ORAL | Status: DC | PRN

## 2018-02-21 MED ORDER — LACTATED RINGERS AMNIOINFUSION
INTRAVENOUS | Status: DC
  Administered 2018-02-21: 03:00:00 via INTRAUTERINE

## 2018-02-21 MED ORDER — ONDANSETRON HCL 4 MG/2ML IJ SOLN: 4.0000 mg | INTRAMUSCULAR | Status: DC | PRN

## 2018-02-21 MED ORDER — SODIUM BICARBONATE 8.4 % IV SOLN
INTRAVENOUS | Status: DC | PRN
  Administered 2018-02-21: 10 mL via EPIDURAL

## 2018-02-21 MED ORDER — OXYCODONE-ACETAMINOPHEN 5-325 MG PO TABS
1.0000 | ORAL_TABLET | ORAL | Status: DC | PRN
  Administered 2018-02-21 – 2018-02-22 (×3): 1 via ORAL
  Filled 2018-02-21 (×3): qty 1

## 2018-02-21 MED ORDER — IBUPROFEN 600 MG PO TABS
600.0000 mg | ORAL_TABLET | Freq: Four times a day (QID) | ORAL | Status: DC
  Administered 2018-02-21 – 2018-02-23 (×9): 600 mg via ORAL
  Filled 2018-02-21 (×9): qty 1

## 2018-02-21 MED ORDER — LACTATED RINGERS IV SOLN
INTRAVENOUS | Status: DC | PRN
  Administered 2018-02-21: 07:00:00 via INTRAVENOUS

## 2018-02-21 MED ORDER — ONDANSETRON HCL 4 MG PO TABS: 4.0000 mg | ORAL_TABLET | ORAL | Status: DC | PRN

## 2018-02-21 MED ORDER — EPHEDRINE SULFATE 50 MG/ML IJ SOLN
INTRAMUSCULAR | Status: DC | PRN
  Administered 2018-02-21 (×2): 10 mg via INTRAVENOUS

## 2018-02-21 SURGICAL SUPPLY — 23 items
ADAPTER VACURETTE TBG SET 14 (CANNULA) ×3 IMPLANT
CATH ROBINSON RED A/P 16FR (CATHETERS) ×3 IMPLANT
CLOTH BEACON ORANGE TIMEOUT ST (SAFETY) ×3 IMPLANT
DECANTER SPIKE VIAL GLASS SM (MISCELLANEOUS) ×3 IMPLANT
GLOVE BIOGEL PI IND STRL 7.0 (GLOVE) ×1 IMPLANT
GLOVE BIOGEL PI IND STRL 7.5 (GLOVE) ×1 IMPLANT
GLOVE BIOGEL PI INDICATOR 7.0 (GLOVE) ×2
GLOVE BIOGEL PI INDICATOR 7.5 (GLOVE) ×2
GLOVE ECLIPSE 7.5 STRL STRAW (GLOVE) ×3 IMPLANT
GOWN STRL REUS W/TWL LRG LVL3 (GOWN DISPOSABLE) ×6 IMPLANT
KIT BERKELEY 1ST TRIMESTER 3/8 (MISCELLANEOUS) ×3 IMPLANT
NS IRRIG 1000ML POUR BTL (IV SOLUTION) ×3 IMPLANT
PACK VAGINAL MINOR WOMEN LF (CUSTOM PROCEDURE TRAY) ×3 IMPLANT
PAD OB MATERNITY 4.3X12.25 (PERSONAL CARE ITEMS) ×3 IMPLANT
PAD PREP 24X48 CUFFED NSTRL (MISCELLANEOUS) ×3 IMPLANT
SET BERKELEY SUCTION TUBING (SUCTIONS) ×6 IMPLANT
TOWEL OR 17X24 6PK STRL BLUE (TOWEL DISPOSABLE) ×6 IMPLANT
TUBE VACURETTE 2ND TRIMESTER (CANNULA) ×3 IMPLANT
VACURETTE 10 RIGID CVD (CANNULA) IMPLANT
VACURETTE 16MM ASPIR CVD .5 (CANNULA) ×3 IMPLANT
VACURETTE 7MM CVD STRL WRAP (CANNULA) IMPLANT
VACURETTE 8 RIGID CVD (CANNULA) IMPLANT
VACURETTE 9 RIGID CVD (CANNULA) IMPLANT

## 2018-02-21 NOTE — Progress Notes (Signed)
Patient ID: Angel Pineda, female   DOB: 1985/09/05, 32 y.o.   MRN: [id number] Angel Pineda is a 32 y.o. G3P2002 at [redacted]w[redacted]d admitted for induction of labor due to [facility].  Called by nurse for variables  Subjective: Pt comfortable, no complaints  Objective: BP (!) 144/92   Pulse (!) 59   Temp 97.9 F (36.6 C) (Oral)   Resp 18   Ht 5\' 2"  (1.575 m)   Wt 120.7 kg   SpO2 99%   BMI 48.65 kg/m  No intake/output data recorded.  FHT:  FHR: 140 bpm, variability: moderate,  accelerations:  Abscent,  decelerations:  Present variables UC:   uc's not tracing well  SVE:   Dilation: 6 Effacement (%): 80 Station: -1 Exam by:: kbooker, cnm IUPC placed w/o difficulty Pitocin @ 15 mu/min  Labs: Lab Results  Component Value Date   WBC 10.0 02/20/2018   HGB 12.2 02/20/2018   HCT 36.0 02/20/2018   MCV 90.7 02/20/2018   PLT 240 02/20/2018    Assessment / Plan: Induction of labor due to GHTN,  progressing well on pitocin, variables, now w/ IUPC, will start amnioinfusion bolus then 136ml/hr  Labor: Progressing normally Fetal Wellbeing:  Category II Pain Control:  Epidural Pre-eclampsia: bp's stable, GHTN I/D:  n/a Anticipated MOD:  NSVD  Angel Pineda CNM, WHNP-BC 02/21/2018, 3:26 AM

## 2018-02-21 NOTE — Anesthesia Postprocedure Evaluation (Signed)
Anesthesia Post Note  Patient: Angel Pineda  Procedure(s) Performed: AN AD HOC LABOR EPIDURAL     Patient location during evaluation: Mother Baby Anesthesia Type: Epidural Level of consciousness: awake, awake and alert and oriented Pain management: pain level controlled Vital Signs Assessment: post-procedure vital signs reviewed and stable Respiratory status: spontaneous breathing and respiratory function stable Cardiovascular status: blood pressure returned to baseline Postop Assessment: no headache, no backache, epidural receding, patient able to bend at knees, no apparent nausea or vomiting, adequate PO intake and able to ambulate Anesthetic complications: no    Last Vitals:  Vitals:   02/21/18 0921 02/21/18 1032  BP: 117/76 117/71  Pulse: 74 80  Resp: 18 17  Temp: 36.5 C 36.8 C  SpO2: 99%     Last Pain:  Vitals:   02/21/18 1230  TempSrc:   PainSc: 0-No pain   Pain Goal: Patients Stated Pain Goal: 1 (02/20/18 1542)               [name], Bearl Mulberry

## 2018-02-21 NOTE — Op Note (Signed)
Alcario Drought D Darr PROCEDURE DATE: 02/20/2018 - 02/21/2018  PREOPERATIVE DIAGNOSIS: partial retained placenta POSTOPERATIVE DIAGNOSIS: The same. PROCEDURE:     Dilation and Evacuation. SURGEON:  Dr. Adrian Blackwater  INDICATIONS: 32 y.o. [phone] with partially retained placenta, needing surgical completion.  Risks of surgery were discussed with the patient including but not limited to: bleeding which may require transfusion; infection which may require antibiotics; injury to uterus or surrounding organs;need for additional procedures including laparotomy or laparoscopy; possibility of intrauterine scarring which may impair future fertility; and other postoperative/anesthesia complications. Written informed consent was obtained.    FINDINGS:  A midline uterus, small amounts of products of conception, specimen sent to pathology.  ANESTHESIA:    Monitored intravenous sedation, paracervical block. INTRAVENOUS FLUIDS:  1000  ml of LR ESTIMATED BLOOD LOSS:  175 ml. SPECIMENS:  Products of conception sent to pathology COMPLICATIONS:  None immediate.  PROCEDURE DETAILS:  The patient received intravenous antibiotics while in the operative area.  She was then taken to the operating room where epidural anesthesia was dosed to surgical level and was found to be adequate.  After an adequate timeout was performed, she was placed in the dorsal lithotomy position and examined; then prepped and draped in the sterile manner.   Her bladder was catheterized for an unmeasured amount of clear, yellow urine. A vaginal speculum was then placed in the patient's vagina and a paracervical block using 1% Marcaine was administered.  A single tooth tenaculum was then applied to the anterior lip of the cervix. The cervix was gently dilated to accommodate a 16 mm suction curette that was gently advanced to the uterine fundus.  The suction device was then activated and curette slowly rotated to clear the uterus of products of conception.  A  sharp curettage was then performed to confirm complete emptying of the uterus with good uterine cry. There was minimal bleeding noted and the tenaculum removed with good hemostasis noted.  The patient tolerated the procedure well.  The patient was taken to the recovery area in stable condition.   Levie Heritage, DO 02/21/2018 7:12 AM

## 2018-02-21 NOTE — Lactation Note (Signed)
This note was copied from a baby's chart. Lactation Consultation Note  Patient Name: Girl Eliyanah Elgersma [id number] Date: 02/21/2018 Reason for consult: Initial assessment Mom's admission choice was breast/formula.  She has decided to exclusively formula feed baby.  Encouraged to call for assist if she changes her mind.  Maternal Data    Feeding Feeding Type: Bottle Fed - Formula  LATCH Score Latch: Grasps breast easily, tongue down, lips flanged, rhythmical sucking.  Audible Swallowing: A few with stimulation  Type of Nipple: Flat  Comfort (Breast/Nipple): Soft / non-tender  Hold (Positioning): Assistance needed to correctly position infant at breast and maintain latch.  LATCH Score: 7  Interventions    Lactation Tools Discussed/Used     Consult Status Consult Status: Complete    Reynolds Kittel S 02/21/2018, 1:35 PM

## 2018-02-21 NOTE — Progress Notes (Signed)
Patient ID: Angel Pineda, female   DOB: October 17, 1985, 32 y.o.   MRN: [id number]  Called to patient's room for retained placenta. On my arrival, most of placenta delivered, but inspection of placenta revealed incomplete delivery. Attempted to manually remove remainder of placenta, but feels like retained placental fragments on anterior uterine wall. Recommended D&E to patient. Discussed risks: uterine perforation, injury to intestines, bladder, ovaries, tubes, scarring, infection. Patient agreeable. OR notified.  Levie Heritage, DO 02/21/2018 6:13 AM

## 2018-02-21 NOTE — Progress Notes (Signed)
Patient ID: Angel Pineda, female   DOB: [birth year], 32 y.o.   MRN: [id number] Angel Pineda is a 32 y.o. G3P2002 at [redacted]w[redacted]d admitted for induction of labor due to [facility].  Subjective: Comfortable w/ epidural  Objective: BP 126/77   Pulse (!) 58   Temp 98.1 F (36.7 C) (Oral)   Resp 17   Ht 5\' 2"  (1.575 m)   Wt 120.7 kg   SpO2 99%   BMI 48.65 kg/m  No intake/output data recorded.  FHT:  FHR: 135 bpm, variability: moderate,  accelerations:  Present,  decelerations:  Absent UC:   regular, every 3-4 minutes  SVE:   Dilation: 5.5 Effacement (%): 60 Station: -2 Exam by:: kbooker, cnm  AROM mod amt pink-tinged fluid  Pitocin @ 15 mu/min  Labs: Lab Results  Component Value Date   WBC 10.0 02/20/2018   HGB 12.2 02/20/2018   HCT 36.0 02/20/2018   MCV 90.7 02/20/2018   PLT 240 02/20/2018    Assessment / Plan: Induction of labor due to GHTN,  progressing well on pitocin, now AROM'd  Labor: Progressing normally Fetal Wellbeing:  Category I Pain Control:  Epidural Pre-eclampsia: bp's stable, GHTN I/D:  n/a Anticipated MOD:  NSVD  Cheral Marker CNM, WHNP-BC 02/21/2018, 2:13 AM

## 2018-02-21 NOTE — Anesthesia Preprocedure Evaluation (Signed)
Anesthesia Evaluation  Patient identified by MRN, date of birth, ID band Patient awake    Reviewed: Allergy & Precautions, H&P , NPO status , Patient's Chart, lab work & pertinent test results  Airway Mallampati: III  TM Distance: >3 FB Neck ROM: full    Dental no notable dental hx. (+) Teeth Intact   Pulmonary former smoker,    Pulmonary exam normal breath sounds clear to auscultation       Cardiovascular hypertension, Normal cardiovascular exam Rhythm:regular Rate:Normal     Neuro/Psych negative neurological ROS  negative psych ROS   GI/Hepatic negative GI ROS, Neg liver ROS,   Endo/Other  Morbid obesity  Renal/GU negative Renal ROS  negative genitourinary   Musculoskeletal negative musculoskeletal ROS (+)   Abdominal (+) + obese,   Peds  Hematology negative hematology ROS (+)   Anesthesia Other Findings   Reproductive/Obstetrics (+) Pregnancy                             Anesthesia Physical  Anesthesia Plan  ASA: III  Anesthesia Plan: Epidural   Post-op Pain Management:    Induction:   PONV Risk Score and Plan: 2 and Treatment may vary due to age or medical condition and Ondansetron  Airway Management Planned: Natural Airway and Nasal Cannula  Additional Equipment:   Intra-op Plan:   Post-operative Plan:   Informed Consent: I have reviewed the patients History and Physical, chart, labs and discussed the procedure including the risks, benefits and alternatives for the proposed anesthesia with the patient or authorized representative who has indicated his/her understanding and acceptance.     Plan Discussed with: CRNA and Surgeon  Anesthesia Plan Comments:         Anesthesia Quick Evaluation

## 2018-02-21 NOTE — Transfer of Care (Signed)
Immediate Anesthesia Transfer of Care Note  Patient: Angel Pineda  Procedure(s) Performed: DILATATION AND EVACUATION (N/A )  Patient Location: PACU  Anesthesia Type:Epidural  Level of Consciousness: awake, alert  and oriented  Airway & Oxygen Therapy: Patient Spontanous Breathing  Post-op Assessment: Report given to RN and Post -op Vital signs reviewed and stable  Post vital signs: Reviewed and stable  Last Vitals:  Vitals Value Taken Time  BP 110/85 02/21/2018  7:18 AM  Temp    Pulse 67 02/21/2018  7:22 AM  Resp 18 02/21/2018  7:22 AM  SpO2 100 % 02/21/2018  7:22 AM  Vitals shown include unvalidated device data.  Last Pain:  Vitals:   02/21/18 0530  TempSrc: Oral  PainSc:       Patients Stated Pain Goal: 1 (02/20/18 1542)  Complications: No apparent anesthesia complications

## 2018-02-21 NOTE — Progress Notes (Signed)
Patient ID: Angel Pineda, female   DOB: March 09, 1986, 32 y.o.   MRN: [id number] Angel Pineda is a 32 y.o. G3P2002 at [redacted]w[redacted]d admitted for induction of labor due to [facility].  Subjective: Comfortable w/ epidural  Objective: BP (!) 139/92   Pulse 72   Temp 98.1 F (36.7 C) (Oral)   Resp 17   Ht 5\' 2"  (1.575 m)   Wt 120.7 kg   SpO2 100%   BMI 48.65 kg/m  No intake/output data recorded.  FHT:  FHR: 135 bpm, variability: moderate,  accelerations:  Present,  decelerations:  Absent UC:   q 5-78mins  SVE:   Dilation: 6 Effacement (%): 60 Station: -2 Exam by:: MJ Laural Benes, RN; Loletha Carrow, RN  Pitocin @ 11 mu/min  Labs: Lab Results  Component Value Date   WBC 10.0 02/20/2018   HGB 12.2 02/20/2018   HCT 36.0 02/20/2018   MCV 90.7 02/20/2018   PLT 240 02/20/2018    Assessment / Plan: IOL d/t GHTN, progressing on pitocin, continue to increase per protocol to acheive adequate labor  Labor: Progressing normally Fetal Wellbeing:  Category I Pain Control:  Epidural Pre-eclampsia: bp's stable I/D:  n/a Anticipated MOD:  NSVD  Cheral Marker CNM, WHNP-BC 02/21/2018, 12:17 AM

## 2018-02-22 DIAGNOSIS — Z3A39 39 weeks gestation of pregnancy: Secondary | ICD-10-CM

## 2018-02-22 DIAGNOSIS — O134 Gestational [pregnancy-induced] hypertension without significant proteinuria, complicating childbirth: Secondary | ICD-10-CM

## 2018-02-22 LAB — CBC
HEMATOCRIT: 23.4 % — AB (ref 36.0–46.0)
Hemoglobin: 8.2 g/dL — ABNORMAL LOW (ref 12.0–15.0)
MCH: 32 pg (ref 26.0–34.0)
MCHC: 35 g/dL (ref 30.0–36.0)
MCV: 91.4 fL (ref 80.0–100.0)
NRBC: 0 % (ref 0.0–0.2)
Platelets: 182 10*3/uL (ref 150–400)
RBC: 2.56 MIL/uL — AB (ref 3.87–5.11)
RDW: 13.1 % (ref 11.5–15.5)
WBC: 8.7 10*3/uL (ref 4.0–10.5)

## 2018-02-22 MED ORDER — FERROUS SULFATE 325 (65 FE) MG PO TABS
325.0000 mg | ORAL_TABLET | Freq: Every morning | ORAL | Status: DC
  Administered 2018-02-22 – 2018-02-23 (×2): 325 mg via ORAL
  Filled 2018-02-22 (×2): qty 1

## 2018-02-22 NOTE — Progress Notes (Addendum)
POSTPARTUM PROGRESS NOTE  Post Partum Day 1 Subjective:  Angel Pineda is a 32 y.o. W9U0454 [redacted]w[redacted]d s/p SVD with D & E for retained placenta fragments, QBL .  No acute events overnight.  Pt denies problems with ambulating, voiding or po intake.  She denies nausea or vomiting.  Pain is well controlled.  She has had flatus. She has not had bowel movement.  Lochia Small.   Objective: Blood pressure 110/74, pulse 82, temperature 97.8 F (36.6 C), temperature source Oral, resp. rate 16, height 5\' 2"  (1.575 m), weight 120.7 kg, SpO2 100 %, unknown if currently breastfeeding.  Physical Exam:  General: alert, cooperative and no distress Lochia: appropriate Chest: CTAB Heart: RRR no m/r/g Abdomen: +BS, soft, nontender Uterine Fundus: firm DVT Evaluation: No calf swelling or tenderness Extremities: on leg compression machine  Recent Labs    02/21/18 1659 02/22/18 0544  HGB 9.7* 8.2*  HCT 28.0* 23.4*    Assessment/Plan:  ASSESSMENT: Angel Pineda is a 32 y.o. U9W1191 [redacted]w[redacted]d s/p SVD with D & E for retained placenta fragments.  #Bottle feeding #Contraception: IUD #Circ: N/A (girl) #Hgb: 8.2 from 9.7, asymptomatic, start iron  #Discharge on PPD#2 or #3    LOS: 2 days   Basilio Cairo, Medical Student 02/22/2018, 9:17 AM   I confirm that I have verified the information documented in the med students's note and that I have also personally reperformed the physical exam and all medical decision making activities.   Patient feels well; is ambulating in the room. Eating and using the bathroom without problem; no fever. Will wait for CBC to come back this evening and plan for discharge tomorrow. Order for ferrous sulfate placed.   Luna Kitchens

## 2018-02-22 NOTE — Progress Notes (Addendum)
Due to high hospital census and acuity, CSW is unable to meet with MOB to complete assessment for [facility] use during pregnancy.  CSW notes that baby's UDS is negative and will monitor CDS result.  CSW will make report to Child Protective Services if warranted.  Please consult CSW if concerns arise or by MOB's request.  Stacy Gardner, LCSW Clinical Social Worker  [facility] [employer]  [phone]

## 2018-02-23 MED ORDER — TETANUS-DIPHTH-ACELL PERTUSSIS 5-2.5-18.5 LF-MCG/0.5 IM SUSP: 0.5000 mL | Freq: Once | INTRAMUSCULAR | 0 refills | Status: AC

## 2018-02-23 MED ORDER — FERROUS SULFATE 325 (65 FE) MG PO TABS: 325.0000 mg | ORAL_TABLET | Freq: Every morning | ORAL | 3 refills | Status: DC

## 2018-02-23 MED ORDER — SENNOSIDES-DOCUSATE SODIUM 8.6-50 MG PO TABS: 2.0000 | ORAL_TABLET | ORAL | 1 refills | Status: DC

## 2018-02-23 MED ORDER — ACETAMINOPHEN 325 MG PO TABS: 650.0000 mg | ORAL_TABLET | ORAL | 1 refills | Status: DC | PRN

## 2018-02-23 MED ORDER — IBUPROFEN 600 MG PO TABS: 600.0000 mg | ORAL_TABLET | Freq: Four times a day (QID) | ORAL | 0 refills | Status: DC

## 2018-02-23 MED ORDER — OXYCODONE-ACETAMINOPHEN 5-325 MG PO TABS: 1.0000 | ORAL_TABLET | ORAL | 0 refills | Status: DC | PRN

## 2018-02-23 NOTE — Discharge Summary (Signed)
Postpartum Discharge Summary     Patient Name: Angel Pineda DOB: 1986-01-20 MRN: [id number]  Date of admission: 02/20/2018 Delivering Provider: Shawna Clamp R   Date of discharge: 02/23/2018  Admitting diagnosis: 39wks induction  Intrauterine pregnancy: [redacted]w[redacted]d     Secondary diagnosis:  Active Problems:   Gestational hypertension   Vaginal delivery   Retained placental fragment  Additional problems:Dilation and curretage for retained placenta     Discharge diagnosis: Term Pregnancy Delivered                                                                                                Post partum procedures:none  Augmentation: AROM, Pitocin and Foley Balloon  Complications: None  Hospital course:  Induction of Labor With Vaginal Delivery   32 y.o. yo G3P3003 at [redacted]w[redacted]d was admitted to the hospital 02/20/2018 for induction of labor.  Indication for induction: Gestational hypertension.  Patient had an uncomplicated labor course as follows: Membrane Rupture Time/Date: 2:11 AM ,02/21/2018   Intrapartum Procedures: Episiotomy: None [1]                                         Lacerations:     Patient had delivery of a Viable infant.  Information for the patient's newborn:  Meno, Girl Shondrika [[id number]]  Delivery Method: Vaginal, Spontaneous(Filed from Delivery Summary)   02/21/2018  Details of delivery can be found in separate delivery note.  Patient had a routine postpartum course. Patient is discharged home 02/23/18.  Magnesium Sulfate recieved: No BMZ received: No  Physical exam  Vitals:   02/22/18 0550 02/22/18 1400 02/22/18 2246 02/23/18 0527  BP: 110/74 123/71 102/65 (!) 127/91  Pulse: 82 98 89 77  Resp: 16 18 16 18   Temp: 97.8 F (36.6 C) 98.1 F (36.7 C) (!) 97.5 F (36.4 C) 98.6 F (37 C)  TempSrc: Oral Oral Oral Oral  SpO2: 100% 100%  98%  Weight:      Height:       General: alert, cooperative and no distress Lochia: appropriate Uterine Fundus:  firm Incision: N/A DVT Evaluation: No evidence of DVT seen on physical exam. Labs: Lab Results  Component Value Date   WBC 8.7 02/22/2018   HGB 8.2 (L) 02/22/2018   HCT 23.4 (L) 02/22/2018   MCV 91.4 02/22/2018   PLT 182 02/22/2018   CMP Latest Ref Rng & Units 02/20/2018  Glucose 70 - 99 mg/dL 78(G)  BUN 6 - 20 mg/dL 7  Creatinine 9.56 - 2.13 mg/dL 0.86  Sodium 578 - 469 mmol/L 135  Potassium 3.5 - 5.1 mmol/L 4.3  Chloride 98 - 111 mmol/L 105  CO2 22 - 32 mmol/L 20(L)  Calcium 8.9 - 10.3 mg/dL 9.1  Total Protein 6.5 - 8.1 g/dL 7.1  Total Bilirubin 0.3 - 1.2 mg/dL 0.5  Alkaline Phos 38 - 126 U/L 196(H)  AST 15 - 41 U/L 24  ALT 0 - 44 U/L 23    Discharge instruction: per After Visit Summary  and "Baby and Me Booklet".  After visit meds:  Allergies as of 02/23/2018   No Known Allergies     Medication List    TAKE these medications   acetaminophen 325 MG tablet Commonly known as:  TYLENOL Take 650 mg by mouth every 6 (six) hours as needed. What changed:  Another medication with the same name was added. Make sure you understand how and when to take each.   acetaminophen 325 MG tablet Commonly known as:  TYLENOL Take 2 tablets (650 mg total) by mouth every 4 (four) hours as needed (for pain scale < 4). What changed:  You were already taking a medication with the same name, and this prescription was added. Make sure you understand how and when to take each.   ferrous sulfate 325 (65 FE) MG tablet Take 1 tablet (325 mg total) by mouth every morning. Start taking on:  02/24/2018   ibuprofen 600 MG tablet Commonly known as:  ADVIL,MOTRIN Take 1 tablet (600 mg total) by mouth every 6 (six) hours.   oxyCODONE-acetaminophen 5-325 MG tablet Commonly known as:  PERCOCET/ROXICET Take 1 tablet by mouth every 4 (four) hours as needed (pain scale 4-7).   prenatal multivitamin Tabs tablet Take 1 tablet by mouth daily at 12 noon.   senna-docusate 8.6-50 MG tablet Commonly  known as:  Senokot-S Take 2 tablets by mouth daily. Start taking on:  02/24/2018   Tdap 5-2.5-18.5 LF-MCG/0.5 injection Commonly known as:  BOOSTRIX Inject 0.5 mLs into the muscle once for 1 dose.       Diet: routine diet  Activity: Advance as tolerated. Pelvic rest for 6 weeks.   Outpatient follow up:6 weeks Follow up Appt:No future appointments. Follow up Visit:    Newborn Data: Live born female  Birth Weight: 6 lb 5.6 oz (2880 g) APGAR: 8, 9  Newborn Delivery   Birth date/time:  02/21/2018 05:44:00 Delivery type:  Vaginal, Spontaneous     Baby Feeding: Bottle and Breast Disposition:home with mother  Patient's BP stable; unable to enroll in Baby Rx program because of technically difficulties; nurse and Dr. Shawnie Pons have both attempted to enroll patient without success. Patient will take her BP twice a day and record the values, then call Monday morning and give the readings to the nurse on the nurse line. Number and instructions given to patient.   Reviewed warning signs with patient for pre-e.  Baby Love visit ordered as well.   Patient Vitals for the past 24 hrs:  BP Temp Temp src Pulse Resp SpO2  02/23/18 0527 (!) 127/91 98.6 F (37 C) Oral 77 18 98 %  02/22/18 2246 102/65 (!) 97.5 F (36.4 C) Oral 89 16 -  02/22/18 1400 123/71 98.1 F (36.7 C) Oral 98 18 100 %     02/23/2018 Marylene Land, CNM

## 2018-02-24 LAB — TYPE AND SCREEN
ABO/RH(D): O POS
Antibody Screen: POSITIVE
DONOR AG TYPE: NEGATIVE
Donor AG Type: NEGATIVE
PT AG TYPE: NEGATIVE
UNIT DIVISION: 0
UNIT DIVISION: 0

## 2018-02-24 LAB — BPAM RBC
BLOOD PRODUCT EXPIRATION DATE: 201910242359
BLOOD PRODUCT EXPIRATION DATE: 201910272359
UNIT TYPE AND RH: 5100
UNIT TYPE AND RH: 5100

## 2018-02-26 NOTE — Anesthesia Postprocedure Evaluation (Signed)
Anesthesia Post Note  Patient: Angel Pineda  Procedure(s) Performed: DILATATION AND EVACUATION (N/A )     Patient location during evaluation: PACU Anesthesia Type: Epidural Level of consciousness: awake Pain management: pain level controlled Vital Signs Assessment: post-procedure vital signs reviewed and stable Respiratory status: spontaneous breathing Cardiovascular status: stable Postop Assessment: no headache, no backache, epidural receding, no apparent nausea or vomiting and patient able to bend at knees Anesthetic complications: no    Last Vitals:  Vitals:   02/22/18 2246 02/23/18 0527  BP: 102/65 (!) 127/91  Pulse: 89 77  Resp: 16 18  Temp: (!) 36.4 C 37 C  SpO2:  98%    Last Pain:  Vitals:   02/23/18 0936  TempSrc:   PainSc: 3    Pain Goal: Patients Stated Pain Goal: 1 (02/23/18 0936)               Almedia Cordell JR,JOHN Susann Givens

## 2019-08-25 ENCOUNTER — Emergency Department (HOSPITAL_COMMUNITY): Payer: Medicaid Other

## 2019-08-25 ENCOUNTER — Other Ambulatory Visit: Payer: Self-pay

## 2019-08-25 ENCOUNTER — Encounter (HOSPITAL_COMMUNITY): Payer: Self-pay | Admitting: Emergency Medicine

## 2019-08-25 ENCOUNTER — Emergency Department (HOSPITAL_COMMUNITY)
Admission: EM | Admit: 2019-08-25 | Discharge: 2019-08-25 | Disposition: A | Discharge status: Home / Self Care | Payer: Medicaid Other | Attending: Emergency Medicine | Admitting: Emergency Medicine

## 2019-08-25 DIAGNOSIS — Z79899 Other long term (current) drug therapy: Secondary | ICD-10-CM | POA: Diagnosis not present

## 2019-08-25 DIAGNOSIS — R2241 Localized swelling, mass and lump, right lower limb: Secondary | ICD-10-CM | POA: Diagnosis not present

## 2019-08-25 DIAGNOSIS — Z87891 Personal history of nicotine dependence: Secondary | ICD-10-CM | POA: Insufficient documentation

## 2019-08-25 DIAGNOSIS — R202 Paresthesia of skin: Secondary | ICD-10-CM | POA: Insufficient documentation

## 2019-08-25 DIAGNOSIS — H538 Other visual disturbances: Secondary | ICD-10-CM | POA: Insufficient documentation

## 2019-08-25 LAB — I-STAT CHEM 8, ED
BUN: 15 mg/dL (ref 6–20)
Calcium, Ion: 1.22 mmol/L (ref 1.15–1.40)
Chloride: 108 mmol/L (ref 98–111)
Creatinine, Ser: 0.9 mg/dL (ref 0.44–1.00)
Glucose, Bld: 87 mg/dL (ref 70–99)
HCT: 40 % (ref 36.0–46.0)
Hemoglobin: 13.6 g/dL (ref 12.0–15.0)
Potassium: 4.2 mmol/L (ref 3.5–5.1)
Sodium: 141 mmol/L (ref 135–145)
TCO2: 27 mmol/L (ref 22–32)

## 2019-08-25 LAB — CBC WITH DIFFERENTIAL/PLATELET
Abs Immature Granulocytes: 0.01 10*3/uL (ref 0.00–0.07)
Basophils Absolute: 0 10*3/uL (ref 0.0–0.1)
Basophils Relative: 1 %
Eosinophils Absolute: 0 10*3/uL (ref 0.0–0.5)
Eosinophils Relative: 1 %
HCT: 39.8 % (ref 36.0–46.0)
Hemoglobin: 13.2 g/dL (ref 12.0–15.0)
Immature Granulocytes: 0 %
Lymphocytes Relative: 38 %
Lymphs Abs: 2.6 10*3/uL (ref 0.7–4.0)
MCH: 29.7 pg (ref 26.0–34.0)
MCHC: 33.2 g/dL (ref 30.0–36.0)
MCV: 89.4 fL (ref 80.0–100.0)
Monocytes Absolute: 0.5 10*3/uL (ref 0.1–1.0)
Monocytes Relative: 7 %
Neutro Abs: 3.6 10*3/uL (ref 1.7–7.7)
Neutrophils Relative %: 53 %
Platelets: 293 10*3/uL (ref 150–400)
RBC: 4.45 MIL/uL (ref 3.87–5.11)
RDW: 12.4 % (ref 11.5–15.5)
WBC: 6.7 10*3/uL (ref 4.0–10.5)
nRBC: 0 % (ref 0.0–0.2)

## 2019-08-25 LAB — I-STAT BETA HCG BLOOD, ED (MC, WL, AP ONLY): I-stat hCG, quantitative: <5 m[IU]/mL (ref <5)

## 2019-08-25 MED ORDER — GADOBUTROL 1 MMOL/ML IV SOLN
8.1000 mL | Freq: Once | INTRAVENOUS | Status: AC | PRN
  Administered 2019-08-25: 8.1 mL via INTRAVENOUS

## 2019-08-25 NOTE — ED Provider Notes (Signed)
[facility] EMERGENCY DEPARTMENT Provider Note   CSN: [id number] Arrival date & time: 08/25/19  1239     History Chief Complaint  Patient presents with  . Leg Swelling  . Numbness    Malikah Principato Sukup is a 34 y.o. female.  The history is provided by the patient and medical records. No language interpreter was used.     34 year old female presenting for evaluation of leg discomfort.  Patient noticed numbness sensation to the anterior aspects of her right lower extremity below her knee but above her ankle since this morning.  Patient states she can feel the pressure but felt that that specific area is numb.  This is new she has never experienced this before.  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  When asked if she has any vision change, patient states she did notice some mild intermittent blurry vision for the past week but denies diplopia or headache.  No history of MS, diabetes, DVT, or any neuro or muscular disease.  She denies any specific treatment tried.  She is able to ambulate.  Past Medical History:  Diagnosis Date  . Chlamydia   . Trichomonas infection   . Urinary tract infection     Patient Active Problem List   Diagnosis Date Noted  . Vaginal delivery 02/21/2018  . 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 Gestational hypertension 02/20/2018    Past Surgical History:  Procedure Laterality Date  . DILATION AND EVACUATION N/A 02/21/2018   Procedure: DILATATION AND EVACUATION;  Surgeon: Levie Heritage, DO;  Location: [facility];  Service: Gynecology;  Laterality: N/A;  . VAGINAL DELIVERY       OB History    Gravida  3   Para  3   Term  3   Preterm      AB      Living  3     SAB      TAB      Ectopic      Multiple  0   Live Births  3           Family History  Problem Relation Age of  Onset  . Hypertension Father   . Diabetes Father   . Diabetes Paternal Grandmother   . Hypertension Paternal Grandmother     Social History   Tobacco Use  . Smoking status: Former Smoker    Packs/day: 0.10    Types: Cigarettes  . Smokeless tobacco: Never Used  Substance Use Topics  . Alcohol use: No  . Drug use: Not Currently    Types: Marijuana    Home Medications Prior to Admission medications   Medication Sig Start Date End Date Taking? Authorizing Provider  acetaminophen (TYLENOL) 325 MG tablet Take 650 mg by mouth every 6 (six) hours as needed.    [provider]  acetaminophen (TYLENOL) 325 MG tablet Take 2 tablets (650 mg total) by mouth every 4 (four) hours as needed (for pain scale < 4). 02/23/18   Marylene Land, CNM  ferrous sulfate 325 (65 FE) MG tablet Take 1 tablet (325 mg total) by mouth every morning. 02/24/18   Marylene Land, CNM  ibuprofen (ADVIL,MOTRIN) 600 MG tablet Take 1 tablet (600 mg total) by mouth every 6 (six) hours. 02/23/18   Marylene Land, CNM  oxyCODONE-acetaminophen (PERCOCET/ROXICET) 5-325 MG tablet Take 1 tablet  by mouth every 4 (four) hours as needed (pain scale 4-7). 02/23/18   Marylene Land, CNM  Prenatal Vit-Fe Fumarate-FA (PRENATAL MULTIVITAMIN) TABS tablet Take 1 tablet by mouth daily at 12 noon.    [provider]  senna-docusate (SENOKOT-S) 8.6-50 MG tablet Take 2 tablets by mouth daily. 02/24/18   Marylene Land, CNM    Allergies    Patient has no known allergies.  Review of Systems   Review of Systems  All other systems reviewed and are negative.   Physical Exam Updated Vital Signs BP 139/77 (BP Location: Right Arm)   Pulse 92   Temp 99.1 F (37.3 C) (Oral)   Resp 16   Ht 5\' 2"  (1.575 m)   Wt 81.6 kg   SpO2 98%   BMI 32.92 kg/m   Physical Exam Vitals and nursing note reviewed.  Constitutional:      General: She is not in acute distress.     Appearance: She is well-developed. She is obese.  HENT:     Head: Atraumatic.  Eyes:     Extraocular Movements: Extraocular movements intact.     Conjunctiva/sclera: Conjunctivae normal.     Pupils: Pupils are equal, round, and reactive to light.  Cardiovascular:     Rate and Rhythm: Normal rate and regular rhythm.     Pulses: Normal pulses.  Pulmonary:     Effort: Pulmonary effort is normal.     Breath sounds: Normal breath sounds.  Abdominal:     Palpations: Abdomen is soft.     Tenderness: There is no abdominal tenderness.  Musculoskeletal:        General: No tenderness.     Cervical back: Neck supple.     Comments: No spinal tenderness.  Skin:    Findings: No rash.  Neurological:     Mental Status: She is alert.     Comments: Right lower extremity: Subjective paresthesia to anterior right tib-fib with sharp and dull sensation, not involving the knee or the ankle or foot.  No overlying skin changes.  Patellar deep tendon reflex intact bilaterally, no foot drops, 5 out of 5 strength to bilateral lower extremities.  Intact dorsalis pedis pulse.  Able to ambulate without difficulty.  Psychiatric:        Mood and Affect: Mood normal.     ED Results / Procedures / Treatments   Labs (all labs ordered are listed, but only abnormal results are displayed) Labs Reviewed  CBC WITH DIFFERENTIAL/PLATELET  I-STAT CHEM 8, ED  I-STAT BETA HCG BLOOD, ED ([facility], [facility], [facility] ONLY)    EKG None  Radiology No results found.  Procedures Procedures (including critical care time)  Medications Ordered in ED Medications - No data to display  ED Course  I have reviewed the triage vital signs and the nursing notes.  Pertinent labs & imaging results that were available during my care of the patient were reviewed by me and considered in my medical decision making (see chart for details).    MDM Rules/Calculators/A&P                      BP 139/77 (BP Location: Right Arm)   Pulse 92    Temp 99.1 F (37.3 C) (Oral)   Resp 16   Ht 5\' 2"  (1.575 m)   Wt 81.6 kg   SpO2 98%   BMI 32.92 kg/m   Final Clinical Impression(s) / ED Diagnoses Final diagnoses:  None  Rx / DC Orders ED Discharge Orders    None     1:46 PM Patient with isolated paresthesia involving the right lower anterior tib-fib region throughout the day today.  Intermittent blurry vision bilaterally without diplopia.  She is able to ambulate without difficulty.  Examination of her skin without any concerning findings to suggest DVT, cellulitis, or bony injury.  She is vascular intact.  She has intact deep tendon reflex at the patella.  Will obtain brain MRI with and without contrast to rule out MS.  Will check electrolytes. Care discussed with Dr. Gilford Raid.   3:23 PM Normal labs.  Pt sign out to oncoming team who will f/u on brain MRI. If negative, anticipate discharge and f/u with pcp.   Domenic Moras, PA-C 08/25/19 [place], Julie, MD 08/25/19 [phone]

## 2019-08-25 NOTE — Discharge Instructions (Signed)
Follow-up with referred to neurology office.  Call and arrange for an appointment.  Return emergency department for any worsening numbness, weakness of the leg, difficulty walking, redness or swelling, color change or any other worsening concerning symptoms.

## 2019-08-25 NOTE — ED Notes (Signed)
Pt transported to MRI 

## 2019-08-25 NOTE — ED Triage Notes (Signed)
Onset yesterday 1930 developed right lower leg numbness and swelling below right knee and above right ankle only in the front.

## 2019-08-25 NOTE — ED Provider Notes (Signed)
Care assumed from Fayrene Helper, PA-C at shift change with MRI brain pending.   In brief, this patient is a 34 y.o. F who presents for evaluation of isolated RLE numbness that occurs only in the tib/fib distribution. No weakness. She is able to ambulate without any difficulty. She has also had some intermittent blurry vision. No diplopia. Please see note from previous provider for full history/physical exam.    Physical Exam  BP 115/71   Pulse 92   Temp 98 F (36.7 C)   Resp 18   Ht 5\' 2"  (1.575 m)   Wt 81.6 kg   SpO2 98%   BMI 32.92 kg/m   Physical Exam   2+ DP pulses bilaterally.  Bilateral lower extremities are symmetric in appearance without any overlying warmth, erythema, edema. 5/5 during the bilateral upper extremities.  Dorsiflexion plantarflexion intact any difficulty.  ED Course/Procedures     Procedures  Results for orders placed or performed during the hospital encounter of 08/25/19 (from the past 24 hour(s))  CBC with Differential     Status: None   Collection Time: 08/25/19  2:05 PM  Result Value Ref Range   WBC 6.7 4.0 - 10.5 K/uL   RBC 4.45 3.87 - 5.11 MIL/uL   Hemoglobin 13.2 12.0 - 15.0 g/dL   HCT 10/25/19 43.3 - 29.5 %   MCV 89.4 80.0 - 100.0 fL   MCH 29.7 26.0 - 34.0 pg   MCHC 33.2 30.0 - 36.0 g/dL   RDW 18.8 41.6 - 60.6 %   Platelets 293 150 - 400 K/uL   nRBC 0.0 0.0 - 0.2 %   Neutrophils Relative % 53 %   Neutro Abs 3.6 1.7 - 7.7 K/uL   Lymphocytes Relative 38 %   Lymphs Abs 2.6 0.7 - 4.0 K/uL   Monocytes Relative 7 %   Monocytes Absolute 0.5 0.1 - 1.0 K/uL   Eosinophils Relative 1 %   Eosinophils Absolute 0.0 0.0 - 0.5 K/uL   Basophils Relative 1 %   Basophils Absolute 0.0 0.0 - 0.1 K/uL   Immature Granulocytes 0 %   Abs Immature Granulocytes 0.01 0.00 - 0.07 K/uL  I-Stat Beta hCG blood, ED ([facility], [facility], [facility] only)     Status: None   Collection Time: 08/25/19  2:19 PM  Result Value Ref Range   I-stat hCG, quantitative <5.0 <5 mIU/mL   Comment 3           I-stat chem 8, ED (not at [facility] or [facility])     Status: None   Collection Time: 08/25/19  2:20 PM  Result Value Ref Range   Sodium 141 135 - 145 mmol/L   Potassium 4.2 3.5 - 5.1 mmol/L   Chloride 108 98 - 111 mmol/L   BUN 15 6 - 20 mg/dL   Creatinine, Ser 10/25/19 0.44 - 1.00 mg/dL   Glucose, Bld 87 70 - 99 mg/dL   Calcium, Ion 6.01 0.93 - 1.40 mmol/L   TCO2 27 22 - 32 mmol/L   Hemoglobin 13.6 12.0 - 15.0 g/dL   HCT 2.35 57.3 - 22.0 %    MDM   PLAN: Patient pending MRI brain to r/o MS. If normal, plan for d/c home. Patient with good distal pulses, no difficulty ambulating.   MDM: I-STAT beta negative.  Chem-8 without any abnormalities.  CBC without any acute abnormalities.  MRI shows no acute intracranial abnormality.  She has essentially normal MRI for age.  There are 2-3 small foci of nonspecific  white matter but they see that this is considered within the normal limits for age.  Discussed results with patient.  We will plan to give her outpatient neurology follow-up.  Encouraged at home supportive care measures. At this time, patient exhibits no emergent life-threatening condition that require further evaluation in ED or admission. Patient had ample opportunity for questions and discussion. All patient's questions were answered with full understanding. Strict return precautions discussed. Patient expresses understanding and agreement to plan.   1. Paresthesia      Portions of this note were generated with Dragon dictation software. Dictation errors may occur despite best attempts at proofreading.     Volanda Napoleon, PA-C 08/25/19 Stefanie Libel, MD 08/27/19 [phone]

## 2019-08-25 NOTE — ED Notes (Signed)
Pt returned from mri alert no distress

## 2019-08-29 ENCOUNTER — Ambulatory Visit: Payer: Medicaid Other | Admitting: Neurology

## 2019-08-29 ENCOUNTER — Telehealth: Payer: Self-pay | Admitting: Neurology

## 2019-08-29 ENCOUNTER — Other Ambulatory Visit: Payer: Self-pay

## 2019-08-29 ENCOUNTER — Encounter: Payer: Self-pay | Admitting: Neurology

## 2019-08-29 VITALS — BP 120/78 | HR 71 | Ht 62.0 in | Wt 202.0 lb

## 2019-08-29 DIAGNOSIS — R200 Anesthesia of skin: Secondary | ICD-10-CM | POA: Diagnosis not present

## 2019-08-29 DIAGNOSIS — Z8669 Personal history of other diseases of the nervous system and sense organs: Secondary | ICD-10-CM

## 2019-08-29 DIAGNOSIS — R202 Paresthesia of skin: Secondary | ICD-10-CM | POA: Diagnosis not present

## 2019-08-29 NOTE — Telephone Encounter (Signed)
medicaid order sent to GI. They will obtain the auth and reach out to the patient to schedule.  °

## 2019-08-29 NOTE — Progress Notes (Signed)
Subjective:    Patient ID: Nychelle Cassata Turrubiates is a 34 y.o. female.  HPI      Huston Foley, MD, PhD [facility] [address], [address] [address] [place], [place] [address]  I saw patient, Avie Checo, as a referral from the Emergency room for right leg numbness.  Patient is unaccompanied today.  She is a 34 year old right-handed woman with an underlying medical history of obesity and otherwise benign history, who reports numbness and tingling in the distal R leg for the past 5 days or so, improving, she feels. Started somewhat gradually, never affected the entire leg, no radiating pain, no bladder or bowel incontinence or retention, no actual weakness, has not fallen.  She feels the numbness is less and the tingly feeling is not painful.  She had an episode of right-sided sciatica some 2 months ago.  She has never had any lumbar spine imaging.  She has some right-sided lower back discomfort at this time but no radiating pain.  She has no other neurological symptoms, no recurrent headaches.  She does have slightly worsening vision overall, has prescription eyeglasses and is due for an exam.  She has not made an appointment yet.  Has not had any injury or fall and does not tend to cross her right leg over the left typically.  She presented to the emergency room on 08/25/2019 with right lower extremity numbness of approximately 1 days duration.  I reviewed the emergency room records.  She had a brain MRI without contrast and with contrast on 08/25/2019 and I reviewed the results: IMPRESSION: 1. No acute intracranial abnormality. 2. Essentially normal for age MRI appearance of the brain; 2 or 3 small foci of nonspecific white matter signal in the right frontal lobe would generally be considered within normal limits for age, and significance is doubtful.   Her Past Medical History Is Significant For: Past Medical History:  Diagnosis Date  . Chlamydia   . Trichomonas infection    . Urinary tract infection     Her Past Surgical History Is Significant For: Past Surgical History:  Procedure Laterality Date  . DILATION AND EVACUATION N/A 02/21/2018   Procedure: DILATATION AND EVACUATION;  Surgeon: Levie Heritage, DO;  Location: [facility];  Service: Gynecology;  Laterality: N/A;  . VAGINAL DELIVERY      Her Family History Is Significant For: Family History  Problem Relation Age of Onset  . Hypertension Father   . Diabetes Father   . Diabetes Paternal Grandmother   . Hypertension Paternal Grandmother     Her Social History Is Significant For: Social History   Socioeconomic History  . Marital status: Single    Spouse name: Not on file  . Number of children: 2  . Years of education: Not on file  . Highest education level: High school graduate  Occupational History  . Not on file  Tobacco Use  . Smoking status: Former Smoker    Packs/day: 0.10    Types: Cigarettes  . Smokeless tobacco: Never Used  Substance and Sexual Activity  . Alcohol use: No  . Drug use: Not Currently    Types: Marijuana  . Sexual activity: Not Currently    Birth control/protection: None  Other Topics Concern  . Not on file  Social History Narrative  . Not on file   Social Determinants of Health   Financial Resource Strain:   . Difficulty of Paying Living Expenses:   Food Insecurity:   .  Worried About Programme researcher, broadcasting/film/video in the Last Year:   . Barista in the Last Year:   Transportation Needs:   . Freight forwarder (Medical):   Marland Kitchen Lack of Transportation (Non-Medical):   Physical Activity:   . Days of Exercise per Week:   . Minutes of Exercise per Session:   Stress:   . Feeling of Stress :   Social Connections:   . Frequency of Communication with Friends and Family:   . Frequency of Social Gatherings with Friends and Family:   . Attends Religious Services:   . Active Member of Clubs or Organizations:   . Attends Banker Meetings:    Marland Kitchen Marital Status:     Her Allergies Are:  No Known Allergies:   Her Current Medications Are:  Outpatient Encounter Medications as of 08/29/2019  Medication Sig  . acetaminophen (TYLENOL) 325 MG tablet Take 650 mg by mouth every 6 (six) hours as needed.  Marland Kitchen ibuprofen (ADVIL,MOTRIN) 600 MG tablet Take 1 tablet (600 mg total) by mouth every 6 (six) hours.  . [DISCONTINUED] acetaminophen (TYLENOL) 325 MG tablet Take 2 tablets (650 mg total) by mouth every 4 (four) hours as needed (for pain scale < 4).  . [DISCONTINUED] ferrous sulfate 325 (65 FE) MG tablet Take 1 tablet (325 mg total) by mouth every morning.  . [DISCONTINUED] Prenatal Vit-Fe Fumarate-FA (PRENATAL MULTIVITAMIN) TABS tablet Take 1 tablet by mouth daily at 12 noon.  . [DISCONTINUED] senna-docusate (SENOKOT-S) 8.6-50 MG tablet Take 2 tablets by mouth daily.  . [DISCONTINUED] oxyCODONE-acetaminophen (PERCOCET/ROXICET) 5-325 MG tablet Take 1 tablet by mouth every 4 (four) hours as needed (pain scale 4-7).   No facility-administered encounter medications on file as of 08/29/2019.  :   Review of Systems:  Out of a complete 14 point review of systems, all are reviewed and negative with the exception of these symptoms as listed below:    Review of Systems  Neurological:       Pt presents today to discuss her numbness and tingling in her right lower legs. Her symptoms are resolving. Pt is wondering if her back pain and numbness/tingling can be related.    Objective:  Neurological Exam  Physical Exam Physical Examination:   Vitals:   08/29/19 0947  BP: 120/78  Pulse: 71    General Examination: The patient is a very pleasant 34 y.o. female in no acute distress. She appears well-developed and well-nourished and well groomed.   HEENT: Normocephalic, atraumatic, pupils are equal, round and reactive to light and accommodation. Funduscopic exam is normal with sharp disc margins noted. Extraocular tracking is good without  limitation to gaze excursion or nystagmus noted. Normal smooth pursuit is noted. Hearing is grossly intact. Tympanic membranes are clear bilaterally. Face is symmetric with normal facial animation and normal facial sensation. Speech is clear with no dysarthria noted. There is no hypophonia. There is no lip, neck/head, jaw or voice tremor. Neck is supple with full range of passive and active motion. There are no carotid bruits on auscultation. Oropharynx exam reveals: good dental hygiene, tongue protrudes centrally and palate elevates symmetrically.   Chest: Clear to auscultation without wheezing, rhonchi or crackles noted.  Heart: S1+S2+0, regular and normal without murmurs, rubs or gallops noted.   Abdomen: Soft, non-tender and non-distended with normal bowel sounds appreciated on auscultation.  Extremities: There is no pitting edema in the distal lower extremities bilaterally. Pedal pulses are intact.  Skin: Warm and dry without  trophic changes noted. There are no varicose veins.  Musculoskeletal: exam reveals no obvious joint deformities, tenderness or joint swelling or erythema.   Neurologically:  Mental status: The patient is awake, alert and oriented in all 4 spheres. Her immediate and remote memory, attention, language skills and fund of knowledge are appropriate. There is no evidence of aphasia, agnosia, apraxia or anomia. Speech is clear with normal prosody and enunciation. Thought process is linear. Mood is normal and affect is normal.  Cranial nerves II - XII are as described above under HEENT exam. In addition: shoulder shrug is normal with equal shoulder height noted. Motor exam: Normal bulk, strength and tone is noted. There is no drift, tremor or rebound. Romberg is negative. Reflexes are 1+ throughout. Babinski: Toes are flexor bilaterally. Fine motor skills and coordination: intact with normal finger taps, normal hand movements, normal rapid alternating patting, normal foot taps and  normal foot agility.  Cerebellar testing: No dysmetria or intention tremor on finger to nose testing. Heel to shin is unremarkable bilaterally. There is no truncal or gait ataxia.  Sensory exam: intact to light touch, pinprick, vibration, temperature sense in the upper and lower extremities, with the exception of decreased pinprick sensation and decreased temperature sensation in the medial aspect of the distal right leg from the knee on down but above the ankle, preserved in the foot.  Gait, station and balance: She stands easily. No veering to one side is noted. No leaning to one side is noted. Posture is age-appropriate and stance is narrow based. Gait shows normal stride length and normal pace. No problems turning are noted. Tandem walk is unremarkable. Intact toe and heel stance is noted.               Assessment and Plan:   In summary, Bhakti Labella Tolson is a very pleasant 34 y.o.-year old female with an underlying medical history of obesity and otherwise benign history, who presents as a referral from the emergency room for evaluation of her right leg numbness of approximately 5 days duration.  She feels that the numbness is less.  She had a baseline MRI brain with and without contrast in the emergency room on 08/25/2019.  She has a normal neurological exam with the exception of decreased sensation to pinprick and temperature sense in the medial aspect of the distal right leg, from below the knee to above the ankle.  She has reflexes that are preserved and symmetrical, no actual weakness noted, no other neurological focal abnormalities.  She is largely reassured.  Nevertheless, given her history of sciatica type symptoms some 2 months ago and her current findings I would like to proceed with a lumbar spine MRI with and without contrast as well as an EMG and nerve conduction velocity test of the lower extremities.  She may benefit from seeing a spine specialist, depending on her findings we may make a  referral.  She is currently not having any pain thankfully.  She is not weak and has not fallen.  She is advised that we will keep her posted as to her test results by phone call and take it from there.  She is reminded to make a follow-up appointment with her eye doctor given that she is due for an exam and has noted that her glasses are not as good.  I answered all her questions today and she was in agreement with the plan.  Star Age, MD, PhD

## 2019-08-29 NOTE — Patient Instructions (Addendum)
You have a small area of numbness in the right leg. Otherwise, your neurological exam is normal thankfully.  Nevertheless, please make sure you have a follow-up appointment for your eye examination as you are due for a checkup and you have noticed that your glasses may not be as good.  Thankfully, your brain MRI was benign as well recently.  As discussed, we will do an EMG and nerve conduction velocity test, which is an electrical nerve and muscle test, which we will schedule. We will call you with the results. We will do a lumbar spine (i.e. lower back) MRI to look for degenerative changes and for signs or nerve root compression.   Depending on your test results, we may have you consult with a spine specialist.  We will call you with the results and take it from there.

## 2019-09-12 ENCOUNTER — Encounter: Payer: Medicaid Other | Admitting: Diagnostic Neuroimaging

## 2019-09-28 ENCOUNTER — Other Ambulatory Visit: Payer: Medicaid Other

## 2019-10-01 NOTE — Telephone Encounter (Signed)
I attempted to reach the pt. Pt was not available and vm was full unable to leave vm. Will try again later.

## 2019-10-01 NOTE — Telephone Encounter (Signed)
Please call patient and advise her that her insurance denied the lumbar spine MRI.  Based on their criteria, she would have to go through conservative evaluation and treatment first for her back pain and right-sided sciatica symptoms.  At this juncture, I would recommend that she talk to her primary care physician about a referral to a spine specialist because part of the insurance criteria for imaging includes conservative treatment such as injection treatment or physical therapy.  Based on these criteria she is best evaluated and managed by a specialist.

## 2019-10-01 NOTE — Telephone Encounter (Signed)
Medicaid did not approve the MRI.  Based on [employer] Guidelines Section(s): SP 6.1 Lower Extremity Pain with Neurological Features (Radiculopathy, Radiculitis, or Plexopathy and Neuropathy) with or without Low Back (Lumbar Spine) Pain and 1.0 General Guidelines, we cannot approve this request. Your records show that you are having lower back pain that travels to your hip and/or leg. The reason this request cannot be approved is because: 1. Your records do not show that you had a recent (within three months) six-week trial of doctor prescribed treatment. Supported treatments include (but are not limited to) medications for swelling or pain, physical therapy, and/or oral or injected steroids.  There is an option to do a peer to peer. The phone number is [phone] and the case number is [id number]  It does need to be scheduled and the deadline for it to be schedule by is Monday 10/07/19.

## 2019-10-02 NOTE — Telephone Encounter (Signed)
I reached out to the pt and we discussed the MRI denial. Pt is agreeable to following up with her PCP for further work up and to discuss referral to spine specialist.

## 2019-10-17 ENCOUNTER — Encounter: Payer: Medicaid Other | Admitting: Diagnostic Neuroimaging

## 2019-10-17 ENCOUNTER — Encounter: Payer: Self-pay | Admitting: Diagnostic Neuroimaging

## 2020-04-27 ENCOUNTER — Emergency Department (HOSPITAL_COMMUNITY)
Admission: EM | Admit: 2020-04-27 | Discharge: 2020-04-27 | Disposition: A | Discharge status: Home / Self Care | Payer: Medicaid Other | Attending: Emergency Medicine | Admitting: Emergency Medicine

## 2020-04-27 ENCOUNTER — Emergency Department (HOSPITAL_COMMUNITY): Payer: Medicaid Other

## 2020-04-27 ENCOUNTER — Other Ambulatory Visit: Payer: Self-pay

## 2020-04-27 DIAGNOSIS — R0789 Other chest pain: Secondary | ICD-10-CM | POA: Diagnosis not present

## 2020-04-27 DIAGNOSIS — R0602 Shortness of breath: Secondary | ICD-10-CM | POA: Diagnosis not present

## 2020-04-27 DIAGNOSIS — Z87891 Personal history of nicotine dependence: Secondary | ICD-10-CM | POA: Insufficient documentation

## 2020-04-27 DIAGNOSIS — M5441 Lumbago with sciatica, right side: Secondary | ICD-10-CM | POA: Diagnosis not present

## 2020-04-27 LAB — I-STAT BETA HCG BLOOD, ED (MC, WL, AP ONLY): I-stat hCG, quantitative: <5 m[IU]/mL (ref <5)

## 2020-04-27 LAB — BASIC METABOLIC PANEL
Anion gap: 9 (ref 5–15)
BUN: 13 mg/dL (ref 6–20)
CO2: 22 mmol/L (ref 22–32)
Calcium: 9.2 mg/dL (ref 8.9–10.3)
Chloride: 108 mmol/L (ref 98–111)
Creatinine, Ser: 0.68 mg/dL (ref 0.44–1.00)
GFR, Estimated: >60 mL/min (ref >60)
Glucose, Bld: 80 mg/dL (ref 70–99)
Potassium: 4.3 mmol/L (ref 3.5–5.1)
Sodium: 139 mmol/L (ref 135–145)

## 2020-04-27 LAB — CBC
HCT: 37.3 % (ref 36.0–46.0)
Hemoglobin: 12.3 g/dL (ref 12.0–15.0)
MCH: 30.7 pg (ref 26.0–34.0)
MCHC: 33 g/dL (ref 30.0–36.0)
MCV: 93 fL (ref 80.0–100.0)
Platelets: 290 10*3/uL (ref 150–400)
RBC: 4.01 MIL/uL (ref 3.87–5.11)
RDW: 13.1 % (ref 11.5–15.5)
WBC: 7.9 10*3/uL (ref 4.0–10.5)
nRBC: 0 % (ref 0.0–0.2)

## 2020-04-27 LAB — TROPONIN I (HIGH SENSITIVITY)
Troponin I (High Sensitivity): <2 ng/L (ref <18)
Troponin I (High Sensitivity): <2 ng/L (ref <18)

## 2020-04-27 MED ORDER — IBUPROFEN 600 MG PO TABS: 600.0000 mg | ORAL_TABLET | Freq: Four times a day (QID) | ORAL | 0 refills | Status: AC | PRN

## 2020-04-27 NOTE — Discharge Instructions (Signed)
Please read and follow all provided instructions.  Your diagnoses today include:  1. Chest wall pain   2. Acute right-sided low back pain with right-sided sciatica     Tests performed today include:  An EKG of your heart  A chest x-ray  Cardiac enzymes - a blood test for heart muscle damage  Blood counts and electrolytes  Vital signs. See below for your results today.   Medications prescribed:   Ibuprofen (Motrin, Advil) - anti-inflammatory pain medication  Do not exceed 600mg  ibuprofen every 6 hours, take with food  You have been prescribed an anti-inflammatory medication or NSAID. Take with food. Take smallest effective dose for the shortest duration needed for your pain. Stop taking if you experience stomach pain or vomiting.   Take any prescribed medications only as directed.  Follow-up instructions: Please follow-up with your primary care provider as soon as you can for further evaluation of your symptoms.   Return instructions:  SEEK IMMEDIATE MEDICAL ATTENTION IF:  You have severe chest pain, especially if the pain is crushing or pressure-like and spreads to the arms, back, neck, or jaw, or if you have sweating, nausea (feeling sick to your stomach), or shortness of breath. THIS IS AN EMERGENCY. Don't wait to see if the pain will go away. Get medical help at once. Call 911 or [phone] (operator). DO NOT drive yourself to the hospital.   Your chest pain gets worse and does not go away with rest.   You have an attack of chest pain lasting longer than usual, despite rest and treatment with the medications your caregiver has prescribed.   You wake from sleep with chest pain or shortness of breath.  You feel dizzy or faint.  You have chest pain not typical of your usual pain for which you originally saw your caregiver.   You have any other emergent concerns regarding your health.  Additional Information: Chest pain comes from many different causes. Your caregiver has  diagnosed you as having chest pain that is not specific for one problem, but does not require admission.  You are at low risk for an acute heart condition or other serious illness.   Your vital signs today were: BP 108/78 (BP Location: Left Arm)   Pulse 77   Temp 98.5 F (36.9 C) (Oral)   Resp 18   LMP 02/27/2020 (Approximate)   SpO2 98%  If your blood pressure (BP) was elevated above 135/85 this visit, please have this repeated by your doctor within one month. --------------

## 2020-04-27 NOTE — ED Provider Notes (Signed)
[facility] EMERGENCY DEPARTMENT Provider Note   CSN: [id number] Arrival date & time: 04/27/20  1405     History Chief Complaint  Patient presents with  . Chest Pain  . Back Pain    Angel Pineda is a 34 y.o. female.  Patient presents the emergency department today for evaluation of chest pain and lower back pain.  Patient reports 2 days of central chest pain described as squeezing in her chest.  This pain is worse with movement and deep breathing.  No reported fever or cough.  Describes shortness of breath but states that this is because it hurts to take a deep breath.  No treatments prior to arrival.  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  She works at [employer] and does repetitive movements as well.  No acute injuries that she knows of.  Patient denies risk factors for pulmonary embolism including: unilateral leg swelling, history of DVT/PE/other blood clots, use of exogenous hormones, recent immobilizations, recent surgery, recent travel (>4hr segment), malignancy, hemoptysis.  No cardiac risk factors were concerning family history but she is a smoker.  Patient also reports pain in her right lower back with radiation to her right leg for several days. No urinary symptoms.  Patient denies warning symptoms of back pain including: fecal incontinence, urinary retention or overflow incontinence, night sweats, waking from sleep with back pain, unexplained fevers or weight loss, h/o cancer, IVDU, recent trauma.           Past Medical History:  Diagnosis Date  . Chlamydia   . Trichomonas infection   . Urinary tract infection     Patient Active Problem List   Diagnosis Date Noted  . Vaginal delivery 02/21/2018  . 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 Gestational hypertension 02/20/2018    Past Surgical History:  Procedure Laterality Date  . DILATION AND  EVACUATION N/A 02/21/2018   Procedure: DILATATION AND EVACUATION;  Surgeon: Levie Heritage, DO;  Location: [facility];  Service: Gynecology;  Laterality: N/A;  . VAGINAL DELIVERY       OB History    Gravida  3   Para  3   Term  3   Preterm      AB      Living  3     SAB      IAB      Ectopic      Multiple  0   Live Births  3           Family History  Problem Relation Age of Onset  . Hypertension Father   . Diabetes Father   . Diabetes Paternal Grandmother   . Hypertension Paternal Grandmother     Social History   Tobacco Use  . Smoking status: Former Smoker    Packs/day: 0.10    Types: Cigarettes  . Smokeless tobacco: Never Used  Substance Use Topics  . Alcohol use: No  . Drug use: Not Currently    Types: Marijuana    Home Medications Prior to Admission medications   Medication Sig Start Date End Date Taking? Authorizing Provider  acetaminophen (TYLENOL) 325 MG tablet Take 650 mg by mouth every 6 (six) hours as needed.    [provider]  ibuprofen (ADVIL) 600 MG tablet Take 1 tablet (600 mg total) by mouth every 6 (six) hours as needed for up to  7 days. 04/27/20 05/04/20  Renne Crigler, PA-C    Allergies    Patient has no known allergies.  Review of Systems   Review of Systems  Constitutional: Negative for diaphoresis, fever and unexpected weight change.  Eyes: Negative for redness.  Respiratory: Positive for shortness of breath. Negative for cough and wheezing.   Cardiovascular: Positive for chest pain. Negative for palpitations and leg swelling.  Gastrointestinal: Negative for abdominal pain, constipation, nausea and vomiting.       Negative for fecal incontinence.   Genitourinary: Negative for dysuria, flank pain, hematuria, pelvic pain, vaginal bleeding and vaginal discharge.       Negative for urinary incontinence or retention.  Musculoskeletal: Positive for back pain. Negative for neck pain.  Skin: Negative for  rash.  Neurological: Negative for syncope, weakness, light-headedness and numbness.       Denies saddle paresthesias.  Psychiatric/Behavioral: The patient is not nervous/anxious.     Physical Exam Updated Vital Signs BP 116/78 (BP Location: Right Arm)   Pulse 78   Temp 98.7 F (37.1 C) (Oral)   Resp 16   LMP 02/27/2020 (Approximate)   SpO2 99%   Physical Exam Vitals and nursing note reviewed.  Constitutional:      Appearance: She is well-developed and well-nourished. She is not diaphoretic.  HENT:     Head: Normocephalic and atraumatic.     Mouth/Throat:     Mouth: Mucous membranes are normal. Mucous membranes are not dry.  Eyes:     Conjunctiva/sclera: Conjunctivae normal.  Neck:     Vascular: Normal carotid pulses. No carotid bruit or JVD.     Trachea: Trachea normal. No tracheal deviation.  Cardiovascular:     Rate and Rhythm: Normal rate and regular rhythm.     Pulses: Intact distal pulses. No decreased pulses.     Heart sounds: Normal heart sounds, S1 normal and S2 normal. No murmur heard.   Pulmonary:     Effort: Pulmonary effort is normal. No respiratory distress.     Breath sounds: No wheezing.  Chest:     Chest wall: Tenderness (Parasternal) present.  Abdominal:     General: Bowel sounds are normal. Aorta is normal.     Palpations: Abdomen is soft.     Tenderness: There is no abdominal tenderness. There is no guarding or rebound.  Musculoskeletal:        General: Normal range of motion.     Cervical back: Normal range of motion and neck supple. No muscular tenderness.     Right lower leg: No tenderness.     Left lower leg: No tenderness.  Skin:    General: Skin is warm and dry.     Coloration: Skin is not pale.     Nails: There is no cyanosis.  Neurological:     Mental Status: She is alert.  Psychiatric:        Mood and Affect: Mood and affect normal.     ED Results / Procedures / Treatments   Labs (all labs ordered are listed, but only abnormal  results are displayed) Labs Reviewed  BASIC METABOLIC PANEL  CBC  I-STAT BETA HCG BLOOD, ED ([facility], [facility], [facility] ONLY)  TROPONIN I (HIGH SENSITIVITY)  TROPONIN I (HIGH SENSITIVITY)    ED ECG REPORT   Date: 04/27/2020  Rate: 65  Rhythm: normal sinus rhythm  QRS Axis: normal  Intervals: normal  ST/T Wave abnormalities: normal  Conduction Disutrbances:none  Narrative Interpretation:   Old EKG Reviewed: unchanged  Radiology DG Chest 2 View  Result Date: 04/27/2020 CLINICAL DATA:  Central chest pain. EXAM: CHEST - 2 VIEW COMPARISON:  None. FINDINGS: The cardiomediastinal contours are normal. Subsegmental atelectasis at the left lung base. Pulmonary vasculature is normal. No consolidation, pleural effusion, or pneumothorax. No acute osseous abnormalities are seen. IMPRESSION: Subsegmental atelectasis at the left lung base. Electronically Signed   By: Narda Rutherford M.D.   On: 04/27/2020 16:02    Procedures Procedures (including critical care time)  Medications Ordered in ED Medications - No data to display  ED Course  I have reviewed the triage vital signs and the nursing notes.  Pertinent labs & imaging results that were available during my care of the patient were reviewed by me and considered in my medical decision making (see chart for details).  Patient seen and examined. Work-up reviewed. Everything is reassuring. EKG without signs of Brugada, prolonged QTC, WPW, heart block, hypertrophy.  Vital signs reviewed and are as follows: BP 116/78 (BP Location: Right Arm)   Pulse 78   Temp 98.7 F (37.1 C) (Oral)   Resp 16   LMP 02/27/2020 (Approximate)   SpO2 99%   Plan: rest, NSAIDs. PCP referral given.   Patient was counseled to return with severe chest pain, especially if the pain is crushing or pressure-like and spreads to the arms, back, neck, or jaw, or if they have sweating, nausea, or shortness of breath with the pain. They were encouraged to call 911 with these  symptoms.   No red flag s/s of low back pain. Patient was counseled on back pain precautions.  Patient urged to follow-up with PCP if pain does not improve with treatment and rest or if pain becomes recurrent. Urged to return with worsening severe pain, loss of bowel or bladder control, trouble walking.   The patient verbalizes understanding and agrees with the plan.    MDM Rules/Calculators/A&P                          Chest pain: Work-up reassuring.  Patient is PERC negative.  Low concern for PE.  Pain is reproducible on exam and worse with movement.  Suspect costochondritis or MSK pain.  Back pain: Patient with back pain. No neurological deficits. Patient is ambulatory. No warning symptoms of back pain including: fecal incontinence, urinary retention or overflow incontinence, night sweats, waking from sleep with back pain, unexplained fevers or weight loss, h/o cancer, IVDU, recent trauma. No concern for cauda equina, epidural abscess, or other serious cause of back pain. Conservative measures such as rest, ice/heat and pain medicine indicated with PCP follow-up if no improvement with conservative management.    Final Clinical Impression(s) / ED Diagnoses Final diagnoses:  Chest wall pain  Acute right-sided low back pain with right-sided sciatica    Rx / DC Orders ED Discharge Orders         Ordered    ibuprofen (ADVIL) 600 MG tablet  Every 6 hours PRN        04/27/20 2255           Renne Crigler, PA-C 04/27/20 2307    Sabas Sous, MD 04/27/20 [phone]

## 2020-04-27 NOTE — ED Triage Notes (Signed)
Pt with central chest pain while at work when she raised her arms up. Pain is intermittent and worse with movement. Also reports pain in L lower back shooting down into leg. Denies injury.

## 2020-07-19 ENCOUNTER — Encounter (HOSPITAL_COMMUNITY): Payer: Self-pay | Admitting: Emergency Medicine

## 2020-07-19 ENCOUNTER — Ambulatory Visit (HOSPITAL_COMMUNITY)
Admission: EM | Admit: 2020-07-19 | Discharge: 2020-07-19 | Disposition: A | Discharge status: Home / Self Care | Payer: Medicaid Other | Attending: Medical Oncology | Admitting: Medical Oncology

## 2020-07-19 ENCOUNTER — Other Ambulatory Visit: Payer: Self-pay

## 2020-07-19 DIAGNOSIS — M5442 Lumbago with sciatica, left side: Secondary | ICD-10-CM

## 2020-07-19 MED ORDER — METHOCARBAMOL 500 MG PO TABS: 500.0000 mg | ORAL_TABLET | Freq: Two times a day (BID) | ORAL | 0 refills | Status: DC

## 2020-07-19 MED ORDER — IBUPROFEN 800 MG PO TABS: 800.0000 mg | ORAL_TABLET | Freq: Three times a day (TID) | ORAL | 0 refills | Status: DC

## 2020-07-19 NOTE — ED Provider Notes (Signed)
[facility]    CSN: [id number] Arrival date & time: 07/19/20  1036      History   Chief Complaint Chief Complaint  Patient presents with  . Flank Pain  . Back Pain    HPI Angel Pineda is a 35 y.o. female.   HPI   Flank Pain: Pt reports that she has had left sided back pain that radiates to the left leg. Symptoms started about 3 days ago.  She suspects that her doing more bending exercises at work recently may have aggravated symptoms as she has had sciatica once before.  Symptoms feel similar.  She states that adjusting her seat has helped with the pain and that bending over worsens the pain.  Pain rated about an 8 out of 10 in nature.  She is not taking anything for pain. NO numbness of groin, incontinence of bowel or bladder and no weakness of the legs.   Past Medical History:  Diagnosis Date  . Chlamydia   . Trichomonas infection   . Urinary tract infection     Patient Active Problem List   Diagnosis Date Noted  . Vaginal delivery 02/21/2018  . 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 Gestational hypertension 02/20/2018    Past Surgical History:  Procedure Laterality Date  . DILATION AND EVACUATION N/A 02/21/2018   Procedure: DILATATION AND EVACUATION;  Surgeon: Levie Heritage, DO;  Location: [facility];  Service: Gynecology;  Laterality: N/A;  . VAGINAL DELIVERY      OB History    Gravida  3   Para  3   Term  3   Preterm      AB      Living  3     SAB      IAB      Ectopic      Multiple  0   Live Births  3            Home Medications    Prior to Admission medications   Medication Sig Start Date End Date Taking? Authorizing Provider  ibuprofen (ADVIL) 800 MG tablet Take 1 tablet (800 mg total) by mouth 3 (three) times daily. 07/19/20  Yes Jaykwon Morones M, PA-C  methocarbamol (ROBAXIN) 500 MG tablet Take 1 tablet (500 mg total) by mouth 2 (two) times daily. 07/19/20  Yes Rushie Chestnut, PA-C    Family  History Family History  Problem Relation Age of Onset  . Hypertension Father   . Diabetes Father   . Diabetes Paternal Grandmother   . Hypertension Paternal Grandmother     Social History Social History   Tobacco Use  . Smoking status: Former Smoker    Packs/day: 0.10    Types: Cigarettes  . Smokeless tobacco: Never Used  Substance Use Topics  . Alcohol use: No  . Drug use: Not Currently    Types: Marijuana     Allergies   Patient has no known allergies.   Review of Systems Review of Systems  As stated above in HPI  Physical Exam Triage Vital Signs ED Triage Vitals [07/19/20 1050]  Enc Vitals Group     BP 130/78     Pulse Rate 88     Resp 18     Temp 98.4 F (36.9 C)     Temp Source Oral     SpO2 100 %     Weight      Height      Head Circumference  Peak Flow      Pain Score 8     Pain Loc      Pain Edu?      Excl. in GC?    No data found.  Updated Vital Signs BP 130/78 (BP Location: Left Arm)   Pulse 88   Temp 98.4 F (36.9 C) (Oral)   Resp 18   SpO2 100%   Breastfeeding No   Physical Exam Vitals and nursing note reviewed.  Constitutional:      Appearance: Normal appearance.  Musculoskeletal:        General: Normal range of motion.     Cervical back: Normal.     Thoracic back: Normal.     Lumbar back: Spasms and tenderness present. No bony tenderness. Negative right straight leg raise test and negative left straight leg raise test.       Back:  Neurological:     General: No focal deficit present.     Mental Status: She is alert.     Motor: No weakness.     Coordination: Coordination normal.     Gait: Gait normal.     Deep Tendon Reflexes: Reflexes normal.      UC Treatments / Results  Labs (all labs ordered are listed, but only abnormal results are displayed) Labs Reviewed - No data to display  EKG   Radiology No results found.  Procedures Procedures (including critical care time)  Medications Ordered in  UC Medications - No data to display  Initial Impression / Assessment and Plan / UC Course  I have reviewed the triage vital signs and the nursing notes.  Pertinent labs & imaging results that were available during my care of the patient were reviewed by me and considered in my medical decision making (see chart for details).     New. Flare of sciatica.  Discussed with patient ways to help prevent reoccurrence.  Treating with muscle relaxer, NSAIDs, heating pad use and muscle stretches.  Gust red flag signs and symptoms.  Work note given per patient request.  Final Clinical Impressions(s) / UC Diagnoses   Final diagnoses:  Acute right-sided low back pain with left-sided sciatica   Discharge Instructions   None    ED Prescriptions    Medication Sig Dispense Auth. Provider   methocarbamol (ROBAXIN) 500 MG tablet Take 1 tablet (500 mg total) by mouth 2 (two) times daily. 20 tablet Jordan Pardini M, [place]   ibuprofen (ADVIL) 800 MG tablet Take 1 tablet (800 mg total) by mouth 3 (three) times daily. 21 tablet Rushie Chestnut, [place]     PDMP not reviewed this encounter.   Rushie Chestnut, [place] 07/19/20 1124

## 2020-07-19 NOTE — ED Triage Notes (Signed)
Pt states that she has left side pain pain that radiates through the left leg. Pt states that her sx started three days.

## 2021-06-24 ENCOUNTER — Encounter (HOSPITAL_COMMUNITY): Payer: Self-pay

## 2021-06-24 ENCOUNTER — Emergency Department (HOSPITAL_COMMUNITY): Payer: Medicaid Other

## 2021-06-24 ENCOUNTER — Emergency Department (HOSPITAL_COMMUNITY)
Admission: EM | Admit: 2021-06-24 | Discharge: 2021-06-24 | Disposition: A | Discharge status: Home / Self Care | Payer: Medicaid Other | Attending: Emergency Medicine | Admitting: Emergency Medicine

## 2021-06-24 DIAGNOSIS — Z20822 Contact with and (suspected) exposure to covid-19: Secondary | ICD-10-CM | POA: Insufficient documentation

## 2021-06-24 DIAGNOSIS — J069 Acute upper respiratory infection, unspecified: Secondary | ICD-10-CM | POA: Insufficient documentation

## 2021-06-24 DIAGNOSIS — R509 Fever, unspecified: Secondary | ICD-10-CM | POA: Diagnosis present

## 2021-06-24 LAB — RESP PANEL BY RT-PCR (FLU A&B, COVID) ARPGX2
Influenza A by PCR: NEGATIVE
Influenza B by PCR: NEGATIVE
SARS Coronavirus 2 by RT PCR: NEGATIVE

## 2021-06-24 MED ORDER — ACETAMINOPHEN 325 MG PO TABS
650.0000 mg | ORAL_TABLET | Freq: Once | ORAL | Status: AC
Start: 2021-06-24 — End: 2021-06-24
  Administered 2021-06-24: 650 mg via ORAL
  Filled 2021-06-24: qty 2

## 2021-06-24 MED ORDER — ONDANSETRON HCL 4 MG/2ML IJ SOLN
4.0000 mg | Freq: Once | INTRAMUSCULAR | Status: AC
  Administered 2021-06-24: 4 mg via INTRAVENOUS
  Filled 2021-06-24: qty 2

## 2021-06-24 MED ORDER — IBUPROFEN 200 MG PO TABS
600.0000 mg | ORAL_TABLET | Freq: Once | ORAL | Status: AC
  Administered 2021-06-24: 600 mg via ORAL
  Filled 2021-06-24: qty 3

## 2021-06-24 MED ORDER — ONDANSETRON 4 MG PO TBDP: 4.0000 mg | ORAL_TABLET | Freq: Three times a day (TID) | ORAL | 0 refills | Status: DC | PRN

## 2021-06-24 MED ORDER — SODIUM CHLORIDE 0.9 % IV BOLUS
2000.0000 mL | Freq: Once | INTRAVENOUS | Status: AC
  Administered 2021-06-24: 2000 mL via INTRAVENOUS

## 2021-06-24 NOTE — ED Provider Notes (Signed)
[facility]-EMERGENCY DEPT Provider Note   CSN: [id number] Arrival date & time: 06/24/21  1806     History  Chief Complaint  Patient presents with   Covid Positive    Khole Arterburn Runions is a 36 y.o. female.  36 year old female presents today for evaluation of fever, chills, generalized myalgias, nausea, vomiting, inability to tolerate p.o. intake.  Patient reports Tuesday she had chills and generalized body aches so she took a at home COVID test which came back positive.  Over the next day she developed cough, nausea, vomiting and was not able to tolerate any p.o. intake.  She has not had any p.o. intake today.  She has been taking over-the-counter flu medicines and Aleve without relief.  The history is provided by the patient. No language interpreter was used.      Home Medications Prior to Admission medications   Medication Sig Start Date End Date Taking? Authorizing Provider  ibuprofen (ADVIL) 800 MG tablet Take 1 tablet (800 mg total) by mouth 3 (three) times daily. 07/19/20   Rushie Chestnut, PA-C  methocarbamol (ROBAXIN) 500 MG tablet Take 1 tablet (500 mg total) by mouth 2 (two) times daily. 07/19/20   Rushie Chestnut, PA-C      Allergies    Patient has no known allergies.    Review of Systems   Review of Systems  Constitutional:  Positive for chills, fatigue and fever. Negative for activity change.  Respiratory:  Positive for cough and shortness of breath.   Cardiovascular:  Negative for chest pain, palpitations and leg swelling.  Gastrointestinal:  Positive for nausea and vomiting. Negative for abdominal pain.  Neurological:  Positive for weakness.  All other systems reviewed and are negative.  Physical Exam Updated Vital Signs BP 129/79 (BP Location: Left Arm)    Pulse (!) 115    Temp (!) 103 F (39.4 C) (Oral)    Resp 16    SpO2 100%  Physical Exam Vitals and nursing note reviewed.  Constitutional:      General: She is not in acute  distress.    Appearance: Normal appearance. She is not ill-appearing.  HENT:     Head: Normocephalic and atraumatic.     Nose: Nose normal.  Eyes:     General: No scleral icterus.    Extraocular Movements: Extraocular movements intact.     Conjunctiva/sclera: Conjunctivae normal.  Cardiovascular:     Rate and Rhythm: Regular rhythm. Tachycardia present.     Pulses: Normal pulses.     Heart sounds: Normal heart sounds.  Pulmonary:     Effort: Pulmonary effort is normal. No respiratory distress.     Breath sounds: Normal breath sounds. No wheezing or rales.  Abdominal:     General: There is no distension.     Tenderness: There is no abdominal tenderness.  Musculoskeletal:        General: Normal range of motion.     Cervical back: Normal range of motion.  Skin:    General: Skin is warm and dry.  Neurological:     General: No focal deficit present.     Mental Status: She is alert. Mental status is at baseline.    ED Results / Procedures / Treatments   Labs (all labs ordered are listed, but only abnormal results are displayed) Labs Reviewed  RESP PANEL BY RT-PCR (FLU A&B, COVID) ARPGX2    EKG None  Radiology No results found.  Procedures Procedures    Medications Ordered  in ED Medications  sodium chloride 0.9 % bolus 2,000 mL (2,000 mLs Intravenous New Bag/Given 06/24/21 1841)  ondansetron (ZOFRAN) injection 4 mg (4 mg Intravenous Given 06/24/21 1843)  ibuprofen (ADVIL) tablet 600 mg (600 mg Oral Given 06/24/21 1842)    ED Course/ Medical Decision Making/ A&P                           Medical Decision Making Amount and/or Complexity of Data Reviewed Radiology: ordered.  Risk OTC drugs. Prescription drug management.   36 year old female presents today for evaluation of generalized body aches, fever, chills, inability to tolerate p.o. intake, nausea, vomiting, shortness of breath of 2-day duration.  Patient tested positive with at home COVID test.  Will obtain  respiratory panel, chest x-ray.  Will provide Zofran, Motrin and IV hydration.  Will reevaluate patient.  Patient following medication and IV hydration with significant symptomatic improvement.  Patient with a temperature of 101.5 and given an additional dose of Tylenol.  Symptomatic treatment discussed.  Patient's respiratory panel negative in the emergency room.  Patient did test positive for COVID at home.  This could either be a false negative in the emergency room where she has a different viral upper respiratory infection.  Chest x-ray negative for pneumonia.  Patient is appropriate for discharge.  Return precautions discussed.  Patient voices understanding and is in agreement with plan.   Final Clinical Impression(s) / ED Diagnoses Final diagnoses:  Viral URI with cough    Rx / DC Orders ED Discharge Orders          Ordered    ondansetron (ZOFRAN-ODT) 4 MG disintegrating tablet  Every 8 hours PRN        06/24/21 1950              Marita [place], PA-C 06/24/21 2300    Lorre Nick, MD 06/25/21 [phone]

## 2021-06-24 NOTE — ED Triage Notes (Signed)
Pt presents with c/o covid positive. Pt reports that she was diagnosed on Tuesday and has not been feeling any better. Pt reports cough, chills, vomiting.

## 2021-06-24 NOTE — Discharge Instructions (Addendum)
You received IV medications and IV fluids in the emergency room with improvement in her symptoms.  Your temperature improved to 101.5 before you left.  You received a dose of Tylenol.  Continue taking Tylenol and Motrin in an alternating fashion at home for fever control.  I have sent in Zofran to the pharmacy for you.  Take this as needed for nausea.  Continue drinking plenty of fluids.  Your chest x-ray was negative for pneumonia.  You are able to drink some fluids in the emergency room and keep them down.  I attached resources for you to establish primary care provider as well.

## 2021-09-25 IMAGING — DX DG CHEST 2V
2 series · 2 of 2 positions shown · non-contrast
Comparison: None.

CLINICAL DATA: Central chest pain.

EXAM:
CHEST - 2 VIEW

[chest pa]
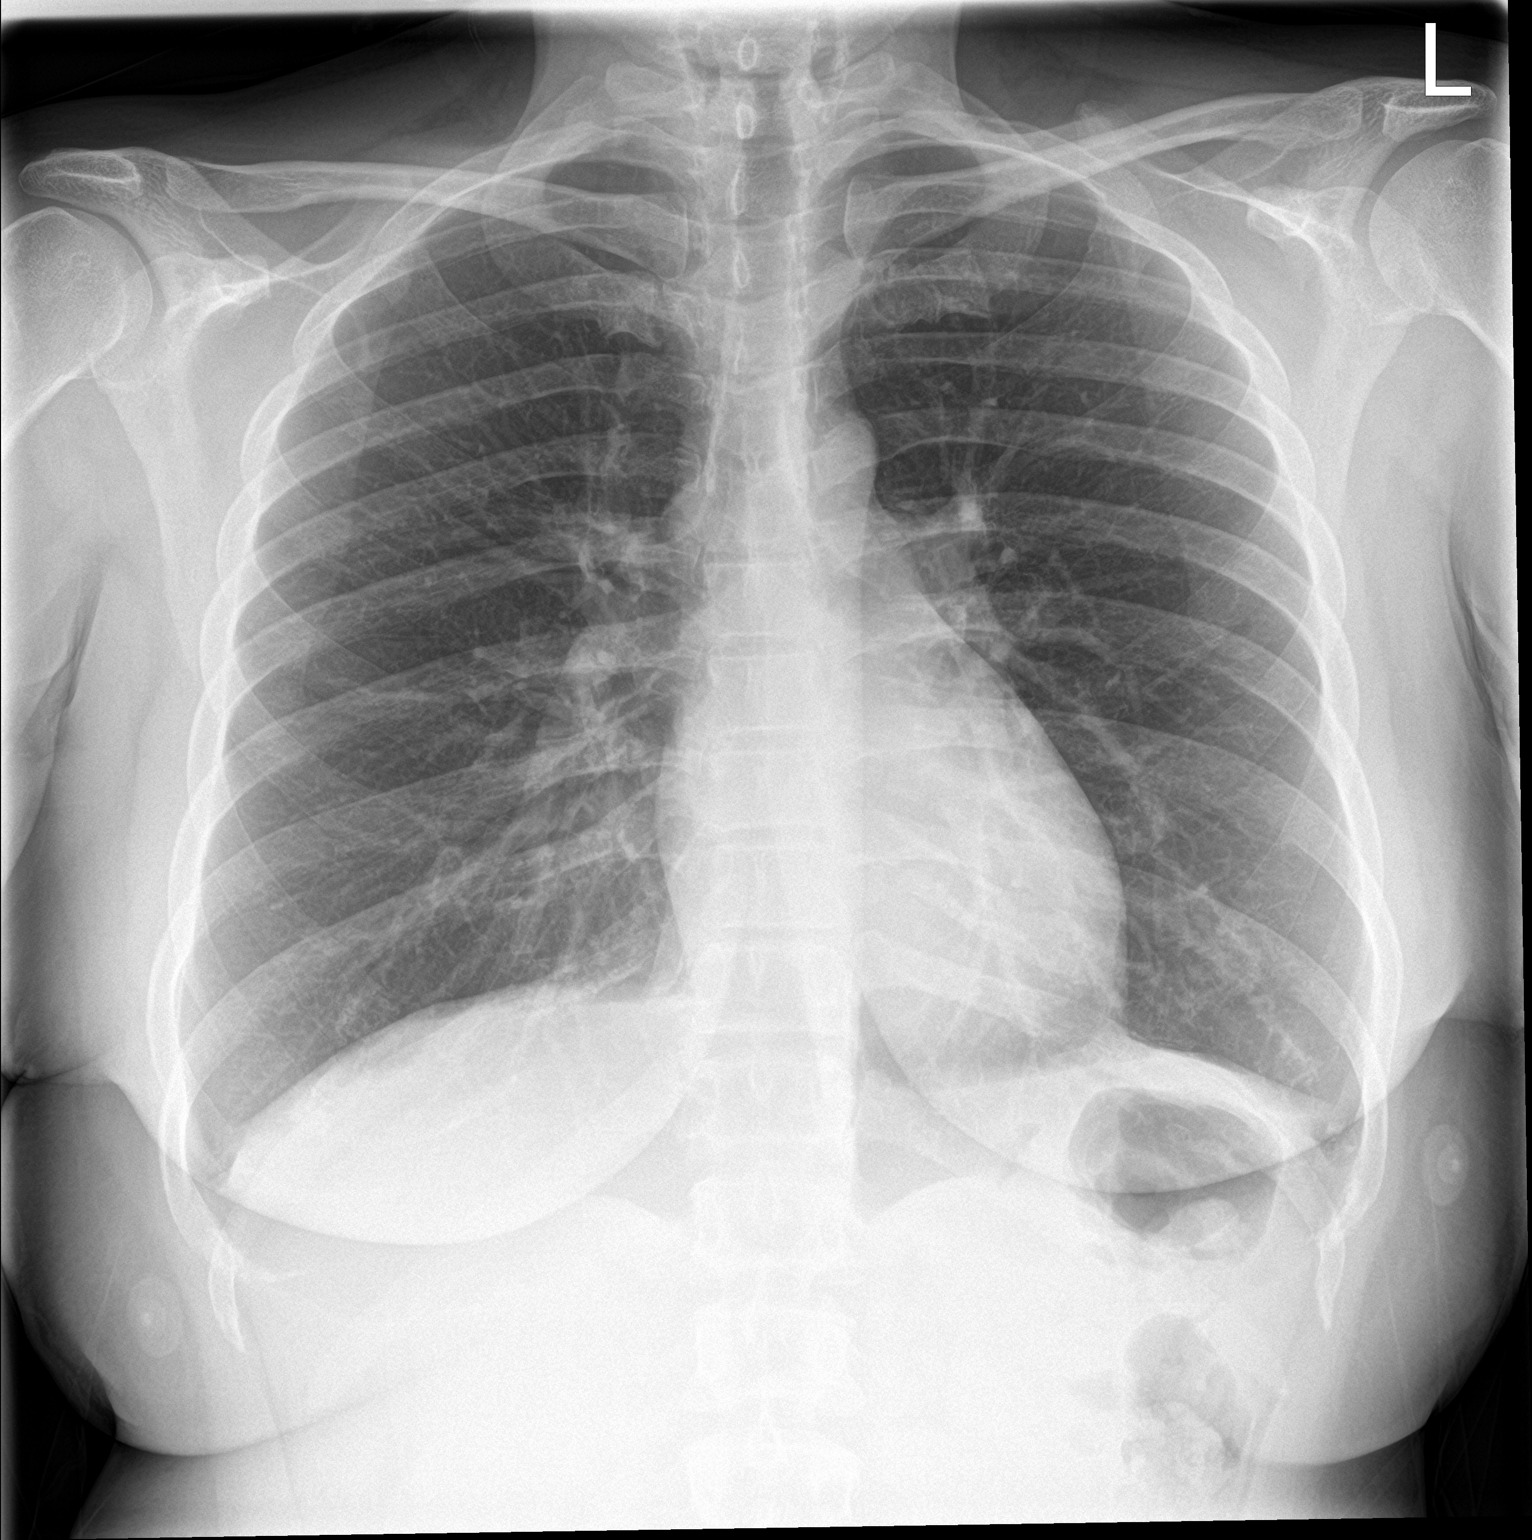

[chest lat]
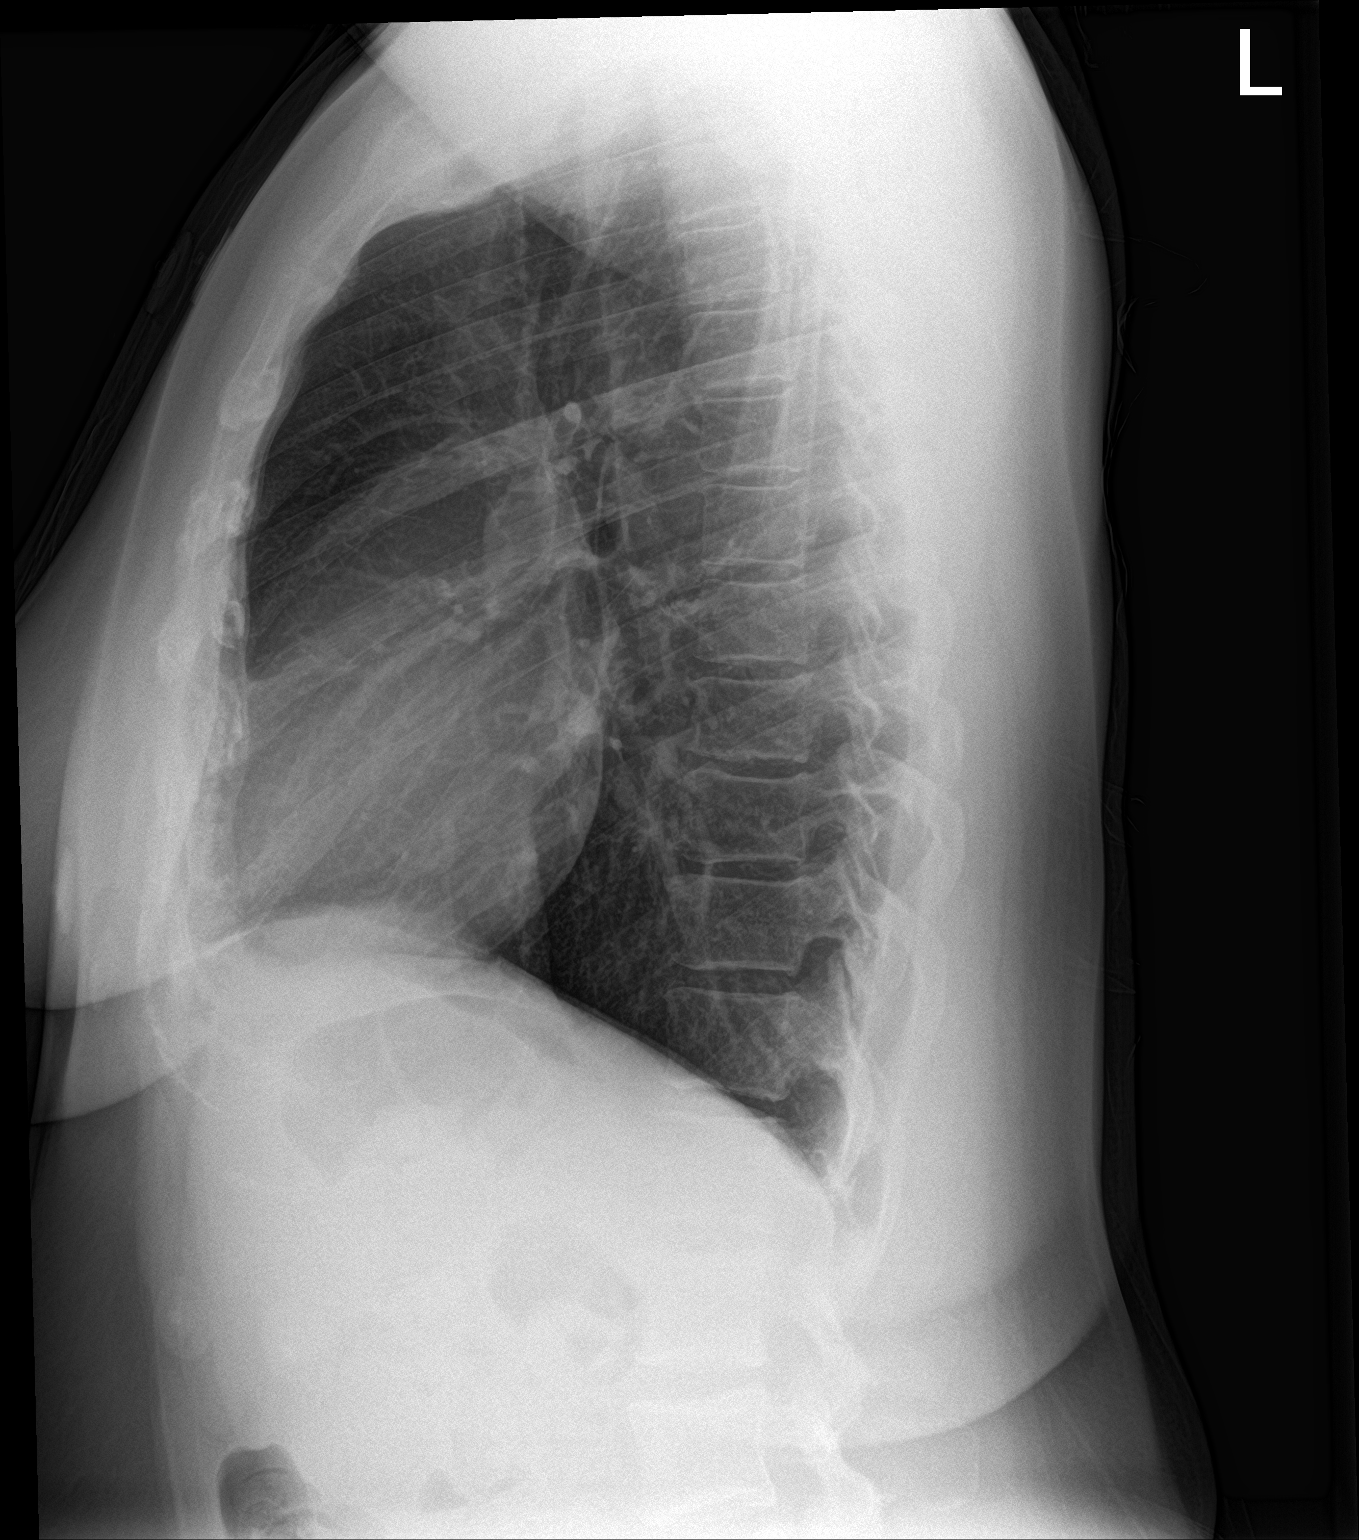

[2 of 2 positions shown; findings below may reference images not displayed]

FINDINGS: The cardiomediastinal contours are normal. Subsegmental atelectasis
at the left lung base. Pulmonary vasculature is normal. No
consolidation, pleural effusion, or pneumothorax. No acute osseous
abnormalities are seen.
IMPRESSION: Subsegmental atelectasis at the left lung base.

## 2021-11-03 ENCOUNTER — Ambulatory Visit (HOSPITAL_COMMUNITY)
Admission: EM | Admit: 2021-11-03 | Discharge: 2021-11-03 | Disposition: A | Discharge status: Home / Self Care | Payer: Medicaid Other | Attending: Physician Assistant | Admitting: Physician Assistant

## 2021-11-03 ENCOUNTER — Encounter (HOSPITAL_COMMUNITY): Payer: Self-pay | Admitting: Emergency Medicine

## 2021-11-03 ENCOUNTER — Other Ambulatory Visit: Payer: Self-pay

## 2021-11-03 DIAGNOSIS — J029 Acute pharyngitis, unspecified: Secondary | ICD-10-CM | POA: Diagnosis not present

## 2021-11-03 DIAGNOSIS — Z20822 Contact with and (suspected) exposure to covid-19: Secondary | ICD-10-CM | POA: Insufficient documentation

## 2021-11-03 DIAGNOSIS — J028 Acute pharyngitis due to other specified organisms: Secondary | ICD-10-CM | POA: Insufficient documentation

## 2021-11-03 DIAGNOSIS — R509 Fever, unspecified: Secondary | ICD-10-CM | POA: Diagnosis present

## 2021-11-03 DIAGNOSIS — J3489 Other specified disorders of nose and nasal sinuses: Secondary | ICD-10-CM

## 2021-11-03 DIAGNOSIS — B9789 Other viral agents as the cause of diseases classified elsewhere: Secondary | ICD-10-CM | POA: Diagnosis not present

## 2021-11-03 LAB — POCT RAPID STREP A, ED / UC: Streptococcus, Group A Screen (Direct): NEGATIVE

## 2021-11-03 MED ORDER — MUPIROCIN 2 % EX OINT: 1.0000 | TOPICAL_OINTMENT | Freq: Two times a day (BID) | CUTANEOUS | 0 refills | Status: DC

## 2021-11-03 MED ORDER — ONDANSETRON 4 MG PO TBDP
ORAL_TABLET | ORAL | Status: AC
  Filled 2021-11-03: qty 1

## 2021-11-03 MED ORDER — ONDANSETRON 4 MG PO TBDP
4.0000 mg | ORAL_TABLET | Freq: Once | ORAL | Status: AC
  Administered 2021-11-03: 4 mg via ORAL

## 2021-11-03 NOTE — Discharge Instructions (Addendum)
Your strep test was negative.  We will contact you if your culture is positive when you start antibiotic.  We tested you for COVID-19 we will contact you if this is positive.  Alternate Tylenol ibuprofen for pain.  Gargle with warm salt water as we discussed.  Use the mupirocin in your nostril to help with that sore.  If you have any worsening symptoms including difficulty speaking, difficulty swallowing, swelling of her throat, high fever not responding to medication, chest pain, shortness of breath you need to go to the emergency room immediately.  If symptoms are not improving by next week please return for reevaluation.

## 2021-11-03 NOTE — ED Triage Notes (Addendum)
Patient c/o fever and chills that started last night.   Patient endorses sore throat and painful swallowing.   Patient endorses generalized body aches.   Patient hasn't taken any medications for symptoms.   Patient used TheraFlu with no relief of symptoms.    Patient c/o LFT nostril swelling x 1 week.   Patient denies trauma to nose.   Patient states " it was kind of hard to blow my nose and I notice some blood in what I blow out".

## 2021-11-03 NOTE — ED Provider Notes (Signed)
[facility]    CSN: [id number] Arrival date & time: 11/03/21  1541      History   Chief Complaint Chief Complaint  Patient presents with   Fever   Chills   Nostril Swelling     HPI Angel Pineda is a 36 y.o. female.   Patient presents today with several concerns.  She reports 24-hour history of sore throat, fatigue, chills.  Reports sore throat pain is rated 5 on a 0-10 pain scale, described as sharp, worse with swallowing, no alleviating factors identified.  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  She reports associated nausea, vomiting, diarrhea.  Denies any cough, congestion, shortness of breath, chest pain.  She has tried TheraFlu without improvement of symptoms.  Denies any recent steroid or antibiotic use.  She is able to eat and drink despite symptoms.  In addition, patient reports a painful lesion in the inferior portion of her left nostril.  She denies any injury or digital manipulation prior to symptom onset.  She reports that it is painful and she has difficulty even using tissues.  She has not tried any over-the-counter medication for symptom management.  Denies history of MRSA.    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 Authorizing Provider  mupirocin ointment (BACTROBAN) 2 % Apply 1 Application topically 2 (two) times daily. 11/03/21  Yes Christain Mcraney, Noberto Retort, PA-C    Family History Family History  Problem Relation Age of Onset   Hypertension Father    Diabetes Father    Diabetes Paternal Grandmother    Hypertension Paternal Grandmother     Social History Social History   Tobacco Use   Smoking status: Former    Packs/day: 0.10    Types: Cigarettes   Smokeless tobacco: Never  Substance Use Topics   Alcohol use: No   Drug use: Not Currently    Types: Marijuana     Allergies   Patient has no known allergies.   Review of Systems Review of Systems  Constitutional:  Positive for activity change and fever. Negative for appetite change and fatigue.  HENT:  Positive for sore throat. Negative for congestion, nosebleeds, sinus pressure, sneezing, trouble swallowing and voice change.   Respiratory:  Negative for cough and shortness of breath.   Cardiovascular:  Negative for chest pain.  Gastrointestinal:  Positive for diarrhea, nausea  and vomiting. Negative for abdominal pain.  Musculoskeletal:  Positive for arthralgias and myalgias.  Neurological:  Negative for dizziness, light-headedness and headaches.     Physical Exam Triage Vital Signs ED Triage Vitals  Enc Vitals Group     BP 11/03/21 1618 134/76     Pulse Rate 11/03/21 1618 (!) 109     Resp 11/03/21 1618 16     Temp 11/03/21 1618 100 F (37.8 C)     Temp Source 11/03/21 1618 Oral     SpO2 11/03/21 1618 100 %     Weight --      Height --      Head Circumference --      Peak Flow --      Pain Score 11/03/21 1622 5     Pain Loc --      Pain Edu? --      Excl. in GC? --    No data found.  Updated Vital Signs BP 134/76 (BP Location: Left Arm)   Pulse (!) 109   Temp 100 F (37.8 C) (Oral)   Resp 16   LMP 10/01/2021   SpO2 100%   Visual  Acuity Right Eye Distance:   Left Eye Distance:   Bilateral Distance:    Right Eye Near:   Left Eye Near:    Bilateral Near:     Physical Exam Vitals reviewed.  Constitutional:      General: She is awake. She is not in acute distress.    Appearance: Normal appearance. She is well-developed. She is not ill-appearing.     Comments: Very pleasant female appears stated age in no acute distress sitting comfortably in exam room  HENT:     Head: Normocephalic and atraumatic.     Right Ear: Tympanic membrane, ear canal and external ear normal. Tympanic membrane is not erythematous or bulging.     Left Ear: Tympanic membrane, ear canal and external ear normal. Tympanic membrane is not erythematous or bulging.     Nose:     Right Sinus: No maxillary sinus tenderness or frontal sinus tenderness.     Left Sinus: No maxillary sinus tenderness or frontal sinus tenderness.     Comments: 0.5 cm ulcerated lesion noted at medial left nare without active bleeding or drainage.    Mouth/Throat:     Pharynx: Uvula midline. Posterior oropharyngeal erythema present. No oropharyngeal exudate.     Tonsils: No tonsillar exudate. 1+ on the right. 1+ on the left.  Cardiovascular:     Rate and Rhythm: Normal rate and regular rhythm.     Heart sounds: Normal heart sounds, S1 normal and S2 normal. No murmur heard. Pulmonary:     Effort: Pulmonary effort is normal.     Breath sounds: Normal breath sounds. No wheezing, rhonchi or rales.     Comments: Clear to auscultation bilaterally Abdominal:     General: Bowel sounds are normal.     Palpations: Abdomen is soft.     Tenderness: There is no abdominal tenderness.  Lymphadenopathy:     Head:     Right side of head: No submental, submandibular or tonsillar adenopathy.     Left side of head: No submental, submandibular or tonsillar adenopathy.     Cervical: No cervical adenopathy.  Psychiatric:        Behavior: Behavior is cooperative.      UC Treatments  / Results  Labs (all labs ordered are listed, but only abnormal results are displayed) Labs Reviewed  CULTURE, GROUP A STREP ([facility])  SARS CORONAVIRUS 2 (TAT 6-24 HRS)  POCT RAPID STREP A, ED / UC    EKG   Radiology No results found.  Procedures Procedures (including critical care time)  Medications Ordered in UC Medications  ondansetron (ZOFRAN-ODT) disintegrating tablet 4 mg (4 mg Oral Given 11/03/21 1707)    Initial Impression / Assessment and Plan / UC Course  I have reviewed the triage vital signs and the nursing notes.  Pertinent labs & imaging results that were available during my care of the patient were reviewed by me and considered in my medical decision making (see chart for details).     Strep testing was negative in clinic today.  Throat culture is pending.  Will defer antibiotics until culture results are available.  COVID testing was obtained today and is pending.  She was provided work excuse note with current CDC return to work guidelines based on COVID test result.  Recommended that she alternate Tylenol and ibuprofen use over-the-counter medications for symptom management.  She is to rest and drink plenty of fluid.  She was prescribed Bactroban for nasal sore.  Discussed that if she has any worsening symptoms including difficulty speaking, difficulty swallowing, swelling of her throat, fever, nausea, vomiting she is to go to the emergency room to which she expressed understanding.  Final Clinical Impressions(s) / UC Diagnoses   Final diagnoses:  Nasal sore  Viral pharyngitis  Sore throat     Discharge Instructions      Your strep test was negative.  We will contact you if your culture is positive when you start antibiotic.  We tested you for COVID-19 we will contact you if this is positive.  Alternate Tylenol ibuprofen for pain.  Gargle with warm salt water as we discussed.  Use the mupirocin in your nostril to help with that sore.  If you have any  worsening symptoms including difficulty speaking, difficulty swallowing, swelling of her throat, high fever not responding to medication, chest pain, shortness of breath you need to go to the emergency room immediately.  If symptoms are not improving by next week please return for reevaluation.     ED Prescriptions     Medication Sig Dispense Auth. Provider   mupirocin ointment (BACTROBAN) 2 % Apply 1 Application topically 2 (two) times daily. 22 g Dennis Hegeman K, PA-C      PDMP not reviewed this encounter.   Jeani Hawking, PA-C 11/03/21 1749

## 2021-11-04 LAB — SARS CORONAVIRUS 2 (TAT 6-24 HRS): SARS Coronavirus 2: NEGATIVE

## 2021-11-06 LAB — CULTURE, GROUP A STREP (THRC)

## 2021-11-08 ENCOUNTER — Ambulatory Visit (HOSPITAL_COMMUNITY)
Admission: RE | Admit: 2021-11-08 | Discharge: 2021-11-08 | Discharge status: Against Medical Advice | Payer: Medicaid Other | Source: Ambulatory Visit

## 2021-11-08 NOTE — ED Triage Notes (Signed)
Pt states she just had questions about her lab results from recent visit 11/03/21; denies need to see provider -- states she was confused about her results. Reviewed Covid and strep cx results, confirming to pt that she does not require any abx at this time. States she's feeling better except for occasional migraines, but does not feel the need to see a provider at this time.

## 2022-11-22 IMAGING — DX DG CHEST 1V PORT
1 series · 1 of 1 positions shown · non-contrast
Comparison: 04/27/2020

CLINICAL DATA: Dyspnea. COVID positive since [REDACTED]. Cough,
chills, vomiting

EXAM:
PORTABLE CHEST 1 VIEW

[chest ap]
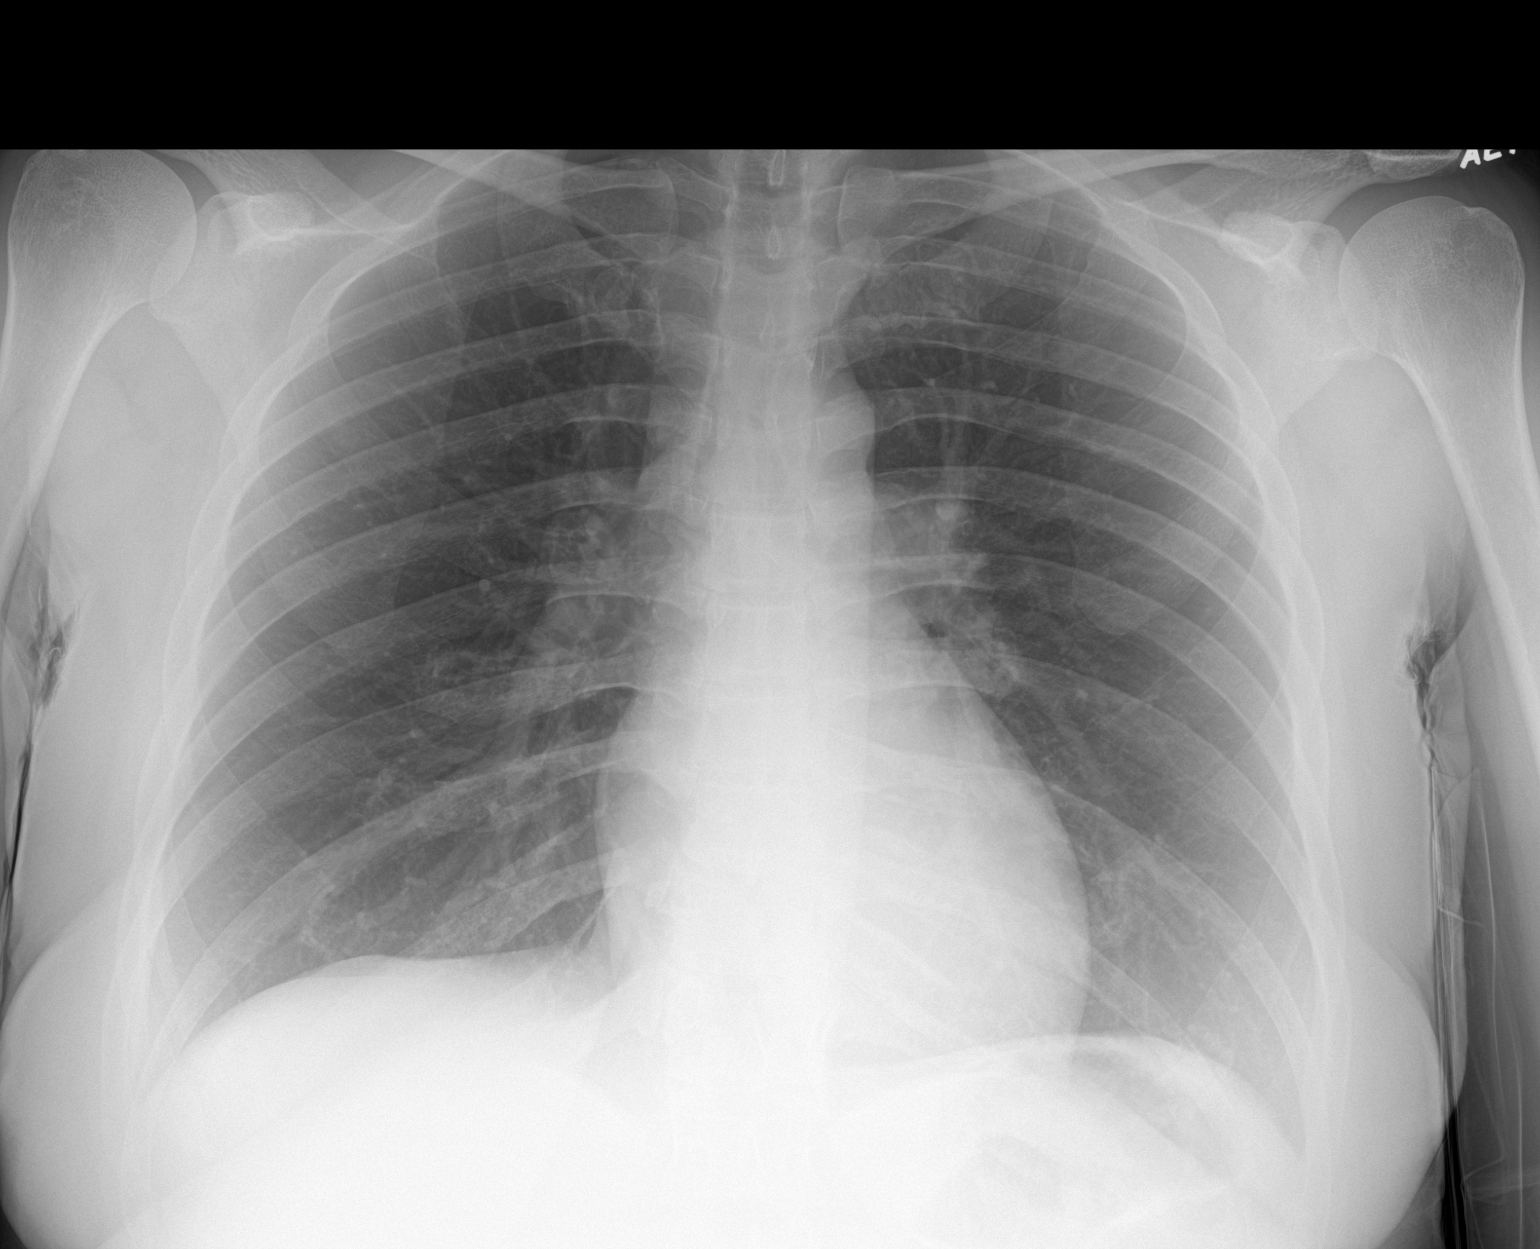

[1 of 1 positions shown; findings below may reference images not displayed]

FINDINGS: The heart size and mediastinal contours are within normal limits.
Both lungs are clear. The visualized skeletal structures are
unremarkable.
IMPRESSION: No active disease.

## 2022-11-28 ENCOUNTER — Ambulatory Visit (HOSPITAL_COMMUNITY)
Admission: RE | Admit: 2022-11-28 | Discharge: 2022-11-28 | Disposition: A | Discharge status: Home / Self Care | Payer: Medicaid Other | Source: Ambulatory Visit | Attending: Physician Assistant | Admitting: Physician Assistant

## 2022-11-28 ENCOUNTER — Encounter (HOSPITAL_COMMUNITY): Payer: Self-pay

## 2022-11-28 VITALS — BP 120/84 | HR 73 | Temp 98.4°F | Resp 18

## 2022-11-28 DIAGNOSIS — M5442 Lumbago with sciatica, left side: Secondary | ICD-10-CM

## 2022-11-28 MED ORDER — LIDOCAINE 5 % EX PTCH: 1.0000 | MEDICATED_PATCH | CUTANEOUS | 0 refills | Status: DC

## 2022-11-28 MED ORDER — METHOCARBAMOL 500 MG PO TABS: 500.0000 mg | ORAL_TABLET | Freq: Two times a day (BID) | ORAL | 0 refills | Status: DC

## 2022-11-28 MED ORDER — IBUPROFEN 800 MG PO TABS: 800.0000 mg | ORAL_TABLET | Freq: Three times a day (TID) | ORAL | 0 refills | Status: DC

## 2022-11-28 NOTE — ED Provider Notes (Signed)
[facility]    CSN: [id number] Arrival date & time: 11/28/22  1429      History   Chief Complaint Chief Complaint  Patient presents with   Back Pain    HPI Angel Pineda is a 37 y.o. female.   Patient presents today with a 2 to 3-day history of left-sided lower back pain.  She reports pain is rated 7 on a 0 10 pain scale, described as sharp, worse with certain movements, no alleviating factors identified.  She has had similar episodes in the past that have resolved without intervention.  She has not tried any over-the-counter medication for symptom management.  She denies any known injury or increase in activity prior to symptom onset.  Denies previous surgery or injury involving her back.  She denies any bowel/bladder incontinence, lower extremity weakness, saddle anesthesia.  She is confident that she is not pregnant.  Her pain is radiating into her leg and she is having difficulty with her heel activities including prolonged walking.      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 Authorizing Provider  ibuprofen (ADVIL) 800 MG tablet Take 1 tablet (800 mg total) by mouth 3 (three) times daily. 11/28/22   Yes Bayne Fosnaugh K, PA-C  lidocaine (LIDODERM) 5 % Place 1 patch onto the skin daily. Remove & Discard patch within 12 hours or as directed by MD 11/28/22  Yes Tally Mattox, Noberto Retort, PA-C  methocarbamol (ROBAXIN) 500 MG tablet Take 1 tablet (500 mg total) by mouth 2 (two) times daily. 11/28/22  Yes Jaidalyn Schillo, Noberto Retort, PA-C    Family History Family History  Problem Relation Age of Onset   Hypertension Father    Diabetes Father    Diabetes Paternal Grandmother    Hypertension Paternal Grandmother     Social History Social History   Tobacco Use   Smoking status: Former    Current packs/day: 0.10    Types: Cigarettes   Smokeless tobacco: Never  Substance Use Topics   Alcohol use: No   Drug use: Not Currently    Types: Marijuana     Allergies   Patient has no known allergies.   Review of Systems Review of Systems  Constitutional:  Positive for activity change. Negative for appetite change, fatigue and fever.  Gastrointestinal:  Negative for abdominal pain, diarrhea, nausea and vomiting.  Genitourinary:  Negative for difficulty urinating, enuresis, frequency  and urgency.  Musculoskeletal:  Positive for back pain. Negative for arthralgias and myalgias.  Neurological:  Negative for weakness and numbness.     Physical Exam Triage Vital Signs ED Triage Vitals  Encounter Vitals Group     BP 11/28/22 1525 120/84     Systolic BP Percentile --      Diastolic BP Percentile --      Pulse Rate 11/28/22 1525 73     Resp 11/28/22 1525 18     Temp 11/28/22 1525 98.4 F (36.9 C)     Temp Source 11/28/22 1525 Oral     SpO2 11/28/22 1525 98 %     Weight --      Height --      Head Circumference --      Peak Flow --      Pain Score 11/28/22 1526 8     Pain Loc --      Pain Education --      Exclude from Growth Chart --    No data found.  Updated Vital Signs BP 120/84 (BP Location: Left Arm)   Pulse 73   Temp 98.4 F (36.9 C) (Oral)   Resp 18   SpO2 98%   Visual Acuity Right Eye  Distance:   Left Eye Distance:   Bilateral Distance:    Right Eye Near:   Left Eye Near:    Bilateral Near:     Physical Exam Vitals reviewed.  Constitutional:      General: She is awake. She is not in acute distress.    Appearance: Normal appearance. She is well-developed. She is not ill-appearing.     Comments: Very pleasant female appears stated age in no acute distress sitting comfortably in exam room  HENT:     Head: Normocephalic and atraumatic.  Cardiovascular:     Rate and Rhythm: Normal rate and regular rhythm.     Heart sounds: Normal heart sounds, S1 normal and S2 normal. No murmur heard. Pulmonary:     Effort: Pulmonary effort is normal.     Breath sounds: Normal breath sounds. No wheezing, rhonchi or rales.     Comments: Clear to auscultation bilaterally Abdominal:     Palpations: Abdomen is soft.     Tenderness: There is no abdominal tenderness.  Musculoskeletal:     Cervical back: No tenderness or bony tenderness.     Thoracic back: No tenderness or bony tenderness.     Lumbar back: Tenderness present. No spasms or bony tenderness. Positive left straight leg raise test. Negative right straight leg raise test.     Comments: Back: No pain with percussion of vertebrae.  No deformity or step-off noted.  Positive straight leg raise on left at approximately 40 degrees.  Negative Faber bilaterally.  Strength 5/5 bilateral lower extremities.  Tender to palpation over left lumbar paraspinal muscles.  Psychiatric:        Behavior: Behavior is cooperative.      UC Treatments / Results  Labs (all labs ordered are listed, but only abnormal results are displayed) Labs Reviewed - No data to display  EKG   Radiology No results found.  Procedures Procedures (including critical care time)  Medications Ordered in UC Medications - No data to display  Initial Impression / Assessment and Plan / UC Course  I have reviewed the triage vital signs and the nursing  notes.  Pertinent labs & imaging results that were available during my care of the patient were reviewed by  me and considered in my medical decision making (see chart for details).     Patient is well-appearing, afebrile, nontoxic, nontachycardic.  Plain films were deferred as she had no focal bony tenderness and denies any recent trauma.  She was started on ibuprofen for pain relief.  We discussed that she is not to take NSAIDs with this medication due to risk of GI bleeding.  She can use Tylenol/acetaminophen for additional symptom relief.  She was also started on Robaxin.  We discussed this can be sedating and she is not to drink alcohol with taking it.  She can also use lidocaine patches for symptom relief we discussed that she should apply this for 12 hours then remove for 12 hours using only 1 patch per 24 hours.  Discussed that ultimately she will need physical therapy and/or more advanced imaging which we cannot arrange in urgent care.  Recommend she follow-up with orthopedics and was given contact information for local provider with instruction to call to schedule appointment.  We discussed that if she has any worsening or changing symptoms she needs to be reevaluated immediately including bowel/bladder incontinence, lower extremity weakness, saddle anesthesia, fever.  Strict return precautions given.  Work excuse note provided.  Final Clinical Impressions(s) / UC Diagnoses   Final diagnoses:  Acute left-sided low back pain with left-sided sciatica     Discharge Instructions      I believe that you have injured the muscles in your back.  Please start ibuprofen 800mg  three times a day as needed.  Do not take other NSAIDs with this medication including aspirin, ibuprofen/Advil, naproxen/Aleve.  You can use acetaminophen/Tylenol for breakthrough pain.  Take Robaxin up to 2 times a day.  This will make you sleepy so do not drive or drink alcohol taking it.  Apply lidocaine patch for 12 hours  during the day; remove this for 12 hours at night.  Use only 1 patch per 24 hours.  I recommend you follow-up with orthopedics; call to schedule an appointment.  If anything worsens and you have severe pain, going to the bathroom on yourself without noticing it, numbness or tingling in your legs, weakness in your legs you need to be seen immediately.      ED Prescriptions     Medication Sig Dispense Auth. Provider   ibuprofen (ADVIL) 800 MG tablet Take 1 tablet (800 mg total) by mouth 3 (three) times daily. 21 tablet Jaquita Bessire K, PA-C   methocarbamol (ROBAXIN) 500 MG tablet Take 1 tablet (500 mg total) by mouth 2 (two) times daily. 20 tablet Verneda Hollopeter K, PA-C   lidocaine (LIDODERM) 5 % Place 1 patch onto the skin daily. Remove & Discard patch within 12 hours or as directed by MD 14 patch Wymon Swaney K, PA-C      PDMP not reviewed this encounter.   Jeani Hawking, PA-C 11/28/22 1546

## 2022-11-28 NOTE — ED Triage Notes (Signed)
Pt c/o lower back pain radiating to lt hip x2 days. Denies injury. Denies taking meds.

## 2022-11-28 NOTE — Discharge Instructions (Addendum)
I believe that you have injured the muscles in your back.  Please start ibuprofen 800mg  three times a day as needed.  Do not take other NSAIDs with this medication including aspirin, ibuprofen/Advil, naproxen/Aleve.  You can use acetaminophen/Tylenol for breakthrough pain.  Take Robaxin up to 2 times a day.  This will make you sleepy so do not drive or drink alcohol taking it.  Apply lidocaine patch for 12 hours during the day; remove this for 12 hours at night.  Use only 1 patch per 24 hours.  I recommend you follow-up with orthopedics; call to schedule an appointment.  If anything worsens and you have severe pain, going to the bathroom on yourself without noticing it, numbness or tingling in your legs, weakness in your legs you need to be seen immediately.

## 2022-12-02 ENCOUNTER — Ambulatory Visit (HOSPITAL_COMMUNITY)
Admission: RE | Admit: 2022-12-02 | Discharge: 2022-12-02 | Disposition: A | Discharge status: Home / Self Care | Payer: Medicaid Other | Source: Ambulatory Visit | Attending: Emergency Medicine | Admitting: Emergency Medicine

## 2022-12-02 ENCOUNTER — Encounter (HOSPITAL_COMMUNITY): Payer: Self-pay

## 2022-12-02 VITALS — BP 124/87 | HR 82 | Temp 98.3°F | Resp 18

## 2022-12-02 DIAGNOSIS — M5442 Lumbago with sciatica, left side: Secondary | ICD-10-CM | POA: Diagnosis not present

## 2022-12-02 MED ORDER — DEXAMETHASONE SODIUM PHOSPHATE 10 MG/ML IJ SOLN
10.0000 mg | Freq: Once | INTRAMUSCULAR | Status: AC
  Administered 2022-12-02: 10 mg via INTRAMUSCULAR

## 2022-12-02 MED ORDER — PREDNISONE 10 MG (21) PO TBPK: ORAL_TABLET | Freq: Every day | ORAL | 0 refills | Status: DC

## 2022-12-02 NOTE — ED Triage Notes (Signed)
Pt seen on 11/28/2022 for sciatica which is no better, she has been taking the meds given at the visit but will need a note for work since she can't hardly stand.

## 2022-12-02 NOTE — ED Provider Notes (Signed)
[facility]    CSN: [id number] Arrival date & time: 12/02/22  1143      History   Chief Complaint Chief Complaint  Patient presents with   Back Pain    Entered by patient    HPI Angel Pineda is a 37 y.o. female.   Patient presents to clinic for complaint of ongoing lower back pain that radiates down her left leg. She has been taking Tylenol, using muscle relaxers and lidocaine patches for her pain. The muscle relaxers provide temporary relief. She denies inner leg numbness, incontinence, trauma, falls or new injuries. Pain improves with soaking in warm bath. Ambulation causes pain.   She does not have a PCP.   The history is provided by the patient and medical records.  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 Authorizing Provider  ibuprofen (ADVIL) 800 MG tablet Take 1 tablet (800 mg total) by mouth 3 (three) times daily. 11/28/22  Yes Raspet, Erin K, PA-C  lidocaine (LIDODERM) 5 % Place 1 patch onto the skin daily. Remove & Discard patch within 12 hours or as directed by MD 11/28/22  Yes Raspet, Noberto Retort, PA-C  methocarbamol (ROBAXIN) 500  MG tablet Take 1 tablet (500 mg total) by mouth 2 (two) times daily. 11/28/22  Yes Raspet, Erin K, PA-C  predniSONE (STERAPRED UNI-PAK 21 TAB) 10 MG (21) TBPK tablet Take by mouth daily. Take 6 tabs by mouth daily  for 2 days, then 5 tabs for 2 days, then 4 tabs for 2 days, then 3 tabs for 2 days, 2 tabs for 2 days, then 1 tab by mouth daily for 2 days 12/02/22  Yes Rinaldo Ratel, [place] N, FNP    Family History Family History  Problem Relation Age of Onset   Hypertension Father    Diabetes Father    Diabetes Paternal Grandmother    Hypertension Paternal Grandmother     Social History Social History   Tobacco Use   Smoking status: Former    Current packs/day: 0.10    Types: Cigarettes   Smokeless tobacco: Never  Substance Use Topics   Alcohol use: No   Drug use: Not Currently    Types: Marijuana     Allergies   Patient has no known allergies.   Review of Systems Review of Systems  Constitutional:  Negative for fever.  Musculoskeletal:  Positive for back pain.  Neurological:  Negative for weakness.     Physical Exam Triage Vital Signs ED Triage Vitals  Encounter Vitals Group     BP 12/02/22 1201 124/87     Systolic BP Percentile --      Diastolic BP Percentile --      Pulse Rate 12/02/22 1201 82     Resp 12/02/22 1201 18     Temp 12/02/22 1201 98.3 F (36.8 C)     Temp Source 12/02/22 1201 Oral     SpO2 12/02/22 1201 99 %     Weight --      Height --      Head Circumference --      Peak Flow --      Pain Score 12/02/22 1159 8     Pain Loc --      Pain Education --      Exclude from Growth Chart --    No data found.  Updated Vital Signs BP 124/87 (BP Location: Left Arm)   Pulse 82   Temp 98.3 F (36.8 C) (Oral)   Resp 18   SpO2 99%   Visual Acuity Right Eye Distance:   Left Eye Distance:   Bilateral Distance:    Right Eye Near:   Left Eye Near:    Bilateral Near:     Physical Exam Vitals and nursing note reviewed.  Constitutional:       Appearance: Normal appearance.  HENT:     Head: Normocephalic and atraumatic.     Right Ear: External ear normal.     Left Ear: External ear normal.     Nose: Nose normal.     Mouth/Throat:     Mouth: Mucous membranes are moist.  Eyes:     Conjunctiva/sclera: Conjunctivae normal.  Cardiovascular:     Rate and Rhythm: Normal rate.     Pulses: Normal pulses.  Pulmonary:     Effort: Pulmonary effort is normal. No respiratory distress.  Musculoskeletal:        General: Tenderness present. No swelling, deformity or signs of injury.     Cervical back: Normal.     Thoracic back: Normal.     Lumbar back: Tenderness present. Positive left straight leg raise test.       Back:     Comments: Left sided lumbar pain that is TTP  Skin:    General: Skin is warm and dry.     Capillary Refill: Capillary refill takes less than 2 seconds.  Neurological:     General: No focal deficit present.     Mental Status: She is alert and oriented to person, place, and time.  Psychiatric:        Mood and Affect: Mood normal.        Behavior: Behavior is cooperative.      UC Treatments / Results  Labs (all labs ordered are listed, but only abnormal results are displayed) Labs Reviewed - No data to display  EKG   Radiology No results found.  Procedures Procedures (including critical care time)  Medications Ordered in UC Medications  dexamethasone (DECADRON) injection 10 mg (has no administration in time range)    Initial Impression / Assessment and Plan / UC Course  I have reviewed the triage vital signs and the nursing notes.  Pertinent labs & imaging results that were available during my care of the patient were reviewed by me and considered in my medical  decision making (see chart for details).  Vitals and triage reviewed, patient is hemodynamically stable. Continued LBP w/ sciatica w/o red flag symptoms. Strength 5/5 in lower extremities, sensation intact. Brisk capillary refill and 2+  pedal pulses. Positive SLR. Will trial IM and PO steroids. Encouraged to establish w/ PCP and ortho f/u if needed. POC, f/u care and return precautions given, no questions at this time.      Final Clinical Impressions(s) / UC Diagnoses   Final diagnoses:  Acute left-sided low back pain with left-sided sciatica     Discharge Instructions      I believe you are experiencing sciatica. Please start the oral steroids tomorrow with breakfast. I have attached some stretching as well to start once this flare-up has resolved, you can continue to take Tylenol.   Please return to clinic for any new or concerning symptoms. Please follow-up with a primary care provider for further evaluation, they can refer you to an orthopedic if needed.     ED Prescriptions     Medication Sig Dispense Auth. Provider   predniSONE (STERAPRED UNI-PAK 21 TAB) 10 MG (21) TBPK tablet Take by mouth daily. Take 6 tabs by mouth daily  for 2 days, then 5 tabs for 2 days, then 4 tabs for 2 days, then 3 tabs for 2 days, 2 tabs for 2 days, then 1 tab by mouth daily for 2 days 21 tablet Rinaldo Ratel, [place] N, [place]      PDMP not reviewed this encounter.   Danea Manter, [place] N, [place] 12/02/22 1231

## 2022-12-02 NOTE — Discharge Instructions (Addendum)
I believe you are experiencing sciatica. Please start the oral steroids tomorrow with breakfast. I have attached some stretching as well to start once this flare-up has resolved, you can continue to take Tylenol.   Please return to clinic for any new or concerning symptoms. Please follow-up with a primary care provider for further evaluation, they can refer you to an orthopedic if needed.

## 2022-12-05 ENCOUNTER — Encounter (HOSPITAL_COMMUNITY): Payer: Self-pay

## 2022-12-28 NOTE — Progress Notes (Unsigned)
New Patient Office Visit  Subjective:   Angel Pineda 14-Jun-1985 12/29/2022  No chief complaint on file.   HPI: Breashia Bayard Cantin presents today to establish care at [facility]. Introduced to Publishing rights manager role and practice setting.  All questions answered.   Last PCP: *** Last annual physical: *** Concerns: See below    The following portions of the patient's history were reviewed and updated as appropriate: past medical history, past surgical history, family history, social history, allergies, medications, and problem list.   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   Gestational hypertension 02/20/2018   Past Medical History:  Diagnosis Date   Chlamydia    Trichomonas infection    Urinary tract infection    Past Surgical History:  Procedure Laterality Date   DILATION AND EVACUATION N/A 02/21/2018   Procedure: DILATATION AND EVACUATION;  Surgeon: Levie Heritage, DO;  Location: [facility];  Service: Gynecology;  Laterality: N/A;   VAGINAL DELIVERY     Family History  Problem Relation Age of Onset   Hypertension Father    Diabetes Father    Diabetes Paternal Grandmother    Hypertension Paternal Grandmother    Social History   Socioeconomic History   Marital status: Single    Spouse name: Not on file   Number of children: 2   Years of education: Not on file   Highest education level: High school graduate  Occupational History   Not on file  Tobacco Use   Smoking status: Former    Current packs/day: 0.10    Types: Cigarettes   Smokeless tobacco: Never  Substance and Sexual Activity   Alcohol use: No   Drug use: Not Currently    Types: Marijuana   Sexual activity: Not Currently    Birth control/protection: I.U.D.  Other Topics Concern   Not on file  Social History Narrative   Not on file   Social  Determinants of Health   Financial Resource Strain: Not on file  Food Insecurity: Not on file  Transportation Needs: No Transportation Needs (02/20/2018)   PRAPARE - Administrator, Civil Service (Medical): No    Lack of Transportation (Non-Medical): No  Physical Activity: Inactive (02/20/2018)   Exercise Vital Sign    Days of Exercise per Week: 0 days    Minutes of Exercise per Session: 0 min  Stress: Not on file  Social Connections: Unknown (04/24/2022)   Received from [facility]   Social Network    Social Network: Not on file  Intimate Partner Violence: Unknown (04/24/2022)   Received from [facility], [facility]   HITS    Physically Hurt: Not on file    Insult or Talk Down To: Not on file    Threaten Physical Harm: Not on file    Scream or Curse: Not on file   Outpatient Medications Prior to Visit  Medication Sig Dispense Refill   ibuprofen (ADVIL) 800 MG tablet Take 1 tablet (800 mg total) by mouth 3 (three) times daily. 21 tablet 0   lidocaine (LIDODERM) 5 % Place 1 patch onto the skin daily. Remove & Discard patch within 12 hours or as directed by MD 14 patch 0   methocarbamol (ROBAXIN) 500 MG tablet Take 1 tablet (500 mg total) by mouth 2 (two) times daily. 20 tablet 0   predniSONE (  STERAPRED UNI-PAK 21 TAB) 10 MG (21) TBPK tablet Take by mouth daily. Take 6 tabs by mouth daily  for 2 days, then 5 tabs for 2 days, then 4 tabs for 2 days, then 3 tabs for 2 days, 2 tabs for 2 days, then 1 tab by mouth daily for 2 days 21 tablet 0   No facility-administered medications prior to visit.   No Known Allergies  ROS: A complete ROS was performed with pertinent positives/negatives noted in the HPI. The remainder of the ROS are negative.   Objective:   There were no vitals filed for this visit.  GENERAL: Well-appearing, in NAD. Well nourished.  SKIN: Pink, warm and dry. No rash, lesion, ulceration, or ecchymoses.  Head: Normocephalic. NECK:  Trachea midline. Full ROM w/o pain or tenderness. No lymphadenopathy.  EARS: Tympanic membranes are intact, translucent without bulging and without drainage. Appropriate landmarks visualized.  EYES: Conjunctiva clear without exudates. EOMI, PERRL, no drainage present.  NOSE: Septum midline w/o deformity. Nares patent, mucosa pink and non-inflamed w/o drainage. No sinus tenderness.  THROAT: Uvula midline. Oropharynx clear. Tonsils non-inflamed without exudate. Mucous membranes pink and moist.  RESPIRATORY: Chest wall symmetrical. Respirations even and non-labored. Breath sounds clear to auscultation bilaterally.  CARDIAC: S1, S2 present, regular rate and rhythm without murmur or gallops. Peripheral pulses 2+ bilaterally.  MSK: Muscle tone and strength appropriate for age. Joints w/o tenderness, redness, or swelling.  EXTREMITIES: Without clubbing, cyanosis, or edema.  NEUROLOGIC: No motor or sensory deficits. Steady, even gait. C2-C12 intact.  PSYCH/MENTAL STATUS: Alert, oriented x 3. Cooperative, appropriate mood and affect.    Health Maintenance Due  Topic Date Due   DTaP/Tdap/Td (1 - Tdap) Never done   PAP SMEAR-Modifier  Never done   COVID-19 Vaccine (1 - 2023-24 season) Never done   INFLUENZA VACCINE  12/15/2022    No results found for any visits on 12/29/22.     Assessment & Plan:  There are no diagnoses linked to this encounter.    Patient to reach out to office if new, worrisome, or unresolved symptoms arise or if no improvement in patient's condition. Patient verbalized understanding and is agreeable to treatment plan. All questions answered to patient's satisfaction.    No follow-ups on file.   Of note, portions of this note may have been created with voice recognition software Physicist, medical). While this note has been edited for accuracy, occasional wrong-word or 'sound-a-like' substitutions may have occurred due to the inherent limitations of voice recognition  software.  Yolanda Manges, FNP

## 2022-12-29 ENCOUNTER — Encounter (HOSPITAL_BASED_OUTPATIENT_CLINIC_OR_DEPARTMENT_OTHER): Payer: Self-pay | Admitting: Family Medicine

## 2022-12-29 ENCOUNTER — Ambulatory Visit (INDEPENDENT_AMBULATORY_CARE_PROVIDER_SITE_OTHER): Payer: Medicaid Other | Admitting: Family Medicine

## 2022-12-29 VITALS — BP 126/86 | HR 79 | Ht 62.0 in | Wt 197.8 lb

## 2022-12-29 DIAGNOSIS — N3001 Acute cystitis with hematuria: Secondary | ICD-10-CM | POA: Diagnosis not present

## 2022-12-29 DIAGNOSIS — R350 Frequency of micturition: Secondary | ICD-10-CM | POA: Diagnosis not present

## 2022-12-29 DIAGNOSIS — Z975 Presence of (intrauterine) contraceptive device: Secondary | ICD-10-CM | POA: Diagnosis not present

## 2022-12-29 DIAGNOSIS — Z7689 Persons encountering health services in other specified circumstances: Secondary | ICD-10-CM

## 2022-12-29 LAB — POCT URINALYSIS DIP (CLINITEK)
Bilirubin, UA: NEGATIVE
Glucose, UA: NEGATIVE mg/dL
Ketones, POC UA: NEGATIVE mg/dL
Nitrite, UA: NEGATIVE
POC PROTEIN,UA: NEGATIVE
Spec Grav, UA: 1.02 (ref 1.010–1.025)
Urobilinogen, UA: 0.2 E.U./dL
pH, UA: 6 (ref 5.0–8.0)

## 2022-12-29 MED ORDER — NITROFURANTOIN MONOHYD MACRO 100 MG PO CAPS: 100.0000 mg | ORAL_CAPSULE | Freq: Two times a day (BID) | ORAL | 0 refills | Status: DC

## 2022-12-29 NOTE — Patient Instructions (Signed)
[facility]

## 2022-12-30 LAB — CBC WITH DIFFERENTIAL/PLATELET
Basophils Absolute: 0 10*3/uL (ref 0.0–0.2)
Basos: 1 %
EOS (ABSOLUTE): 0 10*3/uL (ref 0.0–0.4)
Eos: 1 %
Hematocrit: 40 % (ref 34.0–46.6)
Hemoglobin: 13.2 g/dL (ref 11.1–15.9)
Immature Grans (Abs): 0 10*3/uL (ref 0.0–0.1)
Immature Granulocytes: 0 %
Lymphocytes Absolute: 2.3 10*3/uL (ref 0.7–3.1)
Lymphs: 40 %
MCH: 30.1 pg (ref 26.6–33.0)
MCHC: 33 g/dL (ref 31.5–35.7)
MCV: 91 fL (ref 79–97)
Monocytes Absolute: 0.4 10*3/uL (ref 0.1–0.9)
Monocytes: 7 %
Neutrophils Absolute: 3 10*3/uL (ref 1.4–7.0)
Neutrophils: 51 %
Platelets: 308 10*3/uL (ref 150–450)
RBC: 4.39 x10E6/uL (ref 3.77–5.28)
RDW: 12.4 % (ref 11.7–15.4)
WBC: 5.8 10*3/uL (ref 3.4–10.8)

## 2022-12-30 LAB — LIPID PANEL
Chol/HDL Ratio: 2.3 ratio (ref 0.0–4.4)
Cholesterol, Total: 158 mg/dL (ref 100–199)
HDL: 68 mg/dL (ref >39)
LDL Chol Calc (NIH): 80 mg/dL (ref 0–99)
Triglycerides: 43 mg/dL (ref 0–149)
VLDL Cholesterol Cal: 10 mg/dL (ref 5–40)

## 2022-12-30 LAB — COMPREHENSIVE METABOLIC PANEL
ALT: 16 IU/L (ref 0–32)
AST: 18 IU/L (ref 0–40)
Albumin: 4.5 g/dL (ref 3.9–4.9)
Alkaline Phosphatase: 60 IU/L (ref 44–121)
BUN/Creatinine Ratio: 14 (ref 9–23)
BUN: 12 mg/dL (ref 6–20)
Bilirubin Total: 0.6 mg/dL (ref 0.0–1.2)
CO2: 21 mmol/L (ref 20–29)
Calcium: 9.3 mg/dL (ref 8.7–10.2)
Chloride: 104 mmol/L (ref 96–106)
Creatinine, Ser: 0.85 mg/dL (ref 0.57–1.00)
Globulin, Total: 2.8 g/dL (ref 1.5–4.5)
Glucose: 75 mg/dL (ref 70–99)
Potassium: 4.1 mmol/L (ref 3.5–5.2)
Sodium: 138 mmol/L (ref 134–144)
Total Protein: 7.3 g/dL (ref 6.0–8.5)
eGFR: 89 mL/min/{1.73_m2} (ref >59)

## 2022-12-30 LAB — HEMOGLOBIN A1C
Est. average glucose Bld gHb Est-mCnc: 94 mg/dL
Hgb A1c MFr Bld: 4.9 % (ref 4.8–5.6)

## 2022-12-30 LAB — TSH: TSH: 1.06 u[IU]/mL (ref 0.450–4.500)

## 2022-12-30 NOTE — Progress Notes (Signed)
Patient's labs are within normal limits. Follow up as discussed per visit.

## 2023-01-02 LAB — URINE CULTURE

## 2023-01-02 NOTE — Progress Notes (Signed)
Urine culture indicates susceptibility to Macrobid.  No antibiotic change needed.

## 2023-01-11 NOTE — Progress Notes (Unsigned)
Subjective:   Angel Pineda 1985/06/11  01/12/2023   CC: No chief complaint on file.   HPI: Angel Pineda is a 37 y.o. female who presents for a routine health maintenance exam.  Labs collected at time of visit.    HEALTH SCREENINGS: - Vision Screening: {Blank single:19197::"pap done","not applicable","up to date","done elsewhere"} - Dental Visits: {Blank single:19197::"pap done","not applicable","up to date","done elsewhere"} - Pap smear: {Blank single:19197::"pap done","not applicable","up to date","done elsewhere"} - Breast Exam: {Blank single:19197::"pap done","not applicable","up to date","done elsewhere"} - STD Screening: {Blank single:19197::"Up to date","Ordered today","Not applicable","Declined","Done elsewhere"} - Mammogram (40+): {Blank single:19197::"Up to date","Ordered today","Not applicable","Refused","Done elsewhere"}  - Colonoscopy (45+): {Blank single:19197::"Up to date","Ordered today","Not applicable","Refused","Done elsewhere"}  - Bone Density (65+ or under 65 with predisposing conditions): {Blank single:19197::"Up to date","Ordered today","Not applicable","Refused","Done elsewhere"}  - Lung CA screening with low-dose CT:  {Blank single:19197::"Up to date","Ordered today","Not applicable","Declined","Done elsewhere"} Adults age 56-80 who are current cigarette smokers or quit within the last 15 years. Must have 20 pack year history.   Depression and Anxiety Screen done today and results listed below:     12/29/2022    3:21 PM  Depression screen PHQ 2/9  Decreased Interest 1  Down, Depressed, Hopeless 3  PHQ - 2 Score 4  Altered sleeping 1  Tired, decreased energy 0  Change in appetite 0  Feeling bad or failure about yourself  3  Trouble concentrating 0  Moving slowly or fidgety/restless 0  Suicidal thoughts 1  PHQ-9 Score 9  Difficult doing work/chores Somewhat difficult      12/29/2022    3:21 PM  GAD 7 : Generalized Anxiety Score  Nervous,  Anxious, on Edge 1  Control/stop worrying 3  Worry too much - different things 3  Trouble relaxing 1  Restless 0  Easily annoyed or irritable 3  Afraid - awful might happen 0  Total GAD 7 Score 11  Anxiety Difficulty Somewhat difficult    IMMUNIZATIONS: - Tdap: Tetanus vaccination status reviewed: {tetanus status:315746}. - HPV: {Blank single:19197::"Up to date","Administered today","Not applicable","Refused","Given elsewhere"} - Influenza: {Blank single:19197::"Up to date","Administered today","Postponed to flu season","Refused","Given elsewhere"} - Pneumovax: {Blank single:19197::"Up to date","Administered today","Not applicable","Refused","Given elsewhere"} - Prevnar 20: {Blank single:19197::"Up to date","Administered today","Not applicable","Refused","Given elsewhere"} - Zostavax (50+): {Blank single:19197::"Up to date","Administered today","Not applicable","Refused","Given elsewhere"}   Past medical history, surgical history, medications, allergies, family history and social history reviewed with patient today and changes made to appropriate areas of the chart.   Past Medical History:  Diagnosis Date   Chlamydia    Trichomonas infection    Urinary tract infection     Past Surgical History:  Procedure Laterality Date   DILATION AND EVACUATION N/A 02/21/2018   Procedure: DILATATION AND EVACUATION;  Surgeon: Levie Heritage, DO;  Location: [facility];  Service: Gynecology;  Laterality: N/A;   VAGINAL DELIVERY      Current Outpatient Medications on File Prior to Visit  Medication Sig   ibuprofen (ADVIL) 800 MG tablet Take 1 tablet (800 mg total) by mouth 3 (three) times daily.   levonorgestrel (MIRENA) 20 MCG/DAY IUD 1 each by Intrauterine route once. Placed in 2019 per patient   lidocaine (LIDODERM) 5 % Place 1 patch onto the skin daily. Remove & Discard patch within 12 hours or as directed by MD   methocarbamol (ROBAXIN) 500 MG tablet Take 1 tablet (500 mg total)  by mouth 2 (two) times daily.   nitrofurantoin, macrocrystal-monohydrate, (MACROBID) 100 MG capsule Take 1 capsule (100 mg total) by  mouth 2 (two) times daily.   predniSONE (STERAPRED UNI-PAK 21 TAB) 10 MG (21) TBPK tablet Take by mouth daily. Take 6 tabs by mouth daily  for 2 days, then 5 tabs for 2 days, then 4 tabs for 2 days, then 3 tabs for 2 days, 2 tabs for 2 days, then 1 tab by mouth daily for 2 days   No current facility-administered medications on file prior to visit.    No Known Allergies   Social History   Socioeconomic History   Marital status: Single    Spouse name: Not on file   Number of children: 2   Years of education: Not on file   Highest education level: High school graduate  Occupational History   Not on file  Tobacco Use   Smoking status: Former    Current packs/day: 0.00    Types: Cigarettes    Quit date: 2002    Years since quitting: 22.6   Smokeless tobacco: Never  Vaping Use   Vaping status: Never Used  Substance and Sexual Activity   Alcohol use: No   Drug use: Not Currently    Types: Marijuana   Sexual activity: Not Currently    Birth control/protection: I.U.D.  Other Topics Concern   Not on file  Social History Narrative   Not on file   Social Determinants of Health   Financial Resource Strain: Not on file  Food Insecurity: Not on file  Transportation Needs: No Transportation Needs (02/20/2018)   PRAPARE - Administrator, Civil Service (Medical): No    Lack of Transportation (Non-Medical): No  Physical Activity: Inactive (02/20/2018)   Exercise Vital Sign    Days of Exercise per Week: 0 days    Minutes of Exercise per Session: 0 min  Stress: Not on file  Social Connections: Unknown (04/24/2022)   Received from [facility], [facility]   Social Network    Social Network: Not on file  Intimate Partner Violence: Unknown (04/24/2022)   [facility], [facility]   HITS    Physically Hurt: Not on file     Insult or Talk Down To: Not on file    Threaten Physical Harm: Not on file    Scream or Curse: Not on file   Social History   Tobacco Use  Smoking Status Former   Current packs/day: 0.00   Types: Cigarettes   Quit date: 2002   Years since quitting: 22.6  Smokeless Tobacco Never   Social History   Substance and Sexual Activity  Alcohol Use No    Family History  Problem Relation Age of Onset   Hypertension Father    Diabetes Father    Diabetes Paternal Grandmother    Hypertension Paternal Grandmother      ROS: Denies fever, fatigue, unexplained weight loss/gain, chest pain, SHOB, and palpitations. Denies neurological deficits, gastrointestinal or genitourinary complaints, and skin changes.   Objective:   There were no vitals filed for this visit.  GENERAL APPEARANCE: Well-appearing, in NAD. Well nourished.  SKIN: Pink, warm and dry. Turgor normal. No rash, lesion, ulceration, or ecchymoses. Hair evenly distributed.  HEENT: HEAD: Normocephalic.  EYES: PERRLA. EOMI. Lids intact w/o defect. Sclera white, Conjunctiva pink w/o exudate.  EARS: External ear w/o redness, swelling, masses or lesions. EAC clear. TM's intact, translucent w/o bulging, appropriate landmarks visualized. Appropriate acuity to conversational tones.  NOSE: Septum midline w/o deformity. Nares patent, mucosa pink and non-inflamed w/o drainage. No sinus tenderness.  THROAT:  Uvula midline. Oropharynx clear. Tonsils non-inflamed w/o exudate. Oral mucosa pink and moist.  NECK: Supple, Trachea midline. Full ROM w/o pain or tenderness. No lymphadenopathy. Thyroid non-tender w/o enlargement or palpable masses.  BREASTS: Breasts pendulous, symmetrical, and w/o palpable masses. Nipples everted and w/o discharge. No rash or skin retraction. No axillary or supraclavicular lymphadenopathy.  RESPIRATORY: Chest wall symmetrical w/o masses. Respirations even and non-labored. Breath sounds clear to auscultation bilaterally.  No wheezes, rales, rhonchi, or crackles. CARDIAC: S1, S2 present, regular rate and rhythm. No gallops, murmurs, rubs, or clicks. PMI w/o lifts, heaves, or thrills. No carotid bruits. Capillary refill <2 seconds. Peripheral pulses 2+ bilaterally. GI: Abdomen soft w/o distention. Normoactive bowel sounds. No palpable masses or tenderness. No guarding or rebound tenderness. Liver and spleen w/o tenderness or enlargement. No CVA tenderness.  GU: External genitalia without erythema, lesions, or masses. No lymphadenopathy. Vaginal mucosa pink and moist without exudate, lesions, or ulcerations. Cervix pink without discharge. Cervical os closed. Uterus and adnexae palpable, not enlarged, and w/o tenderness. No palpable masses.  MSK: Muscle tone and strength appropriate for age, w/o atrophy or abnormal movement.  EXTREMITIES: Active ROM intact, w/o tenderness, crepitus, or contracture. No obvious joint deformities or effusions. No clubbing, edema, or cyanosis.  NEUROLOGIC: CN's II-XII intact. Motor strength symmetrical with no obvious weakness. No sensory deficits. DTR's 2+ symmetric bilaterally. Steady, even gait.  PSYCH/MENTAL STATUS: Alert, oriented x 3. Cooperative, appropriate mood and affect.   Chaperoned by Cristy Hilts, CMA   Results for orders placed or performed in visit on 12/29/22  Urine Culture   Specimen: Urine   UR  Result Value Ref Range   Urine Culture, Routine Final report (A)    Organism ID, Bacteria Escherichia coli (A)    Antimicrobial Susceptibility Comment   Lipid panel  Result Value Ref Range   Cholesterol, Total 158 100 - 199 mg/dL   Triglycerides 43 0 - 149 mg/dL   HDL 68 >86 mg/dL   VLDL Cholesterol Cal 10 5 - 40 mg/dL   LDL Chol Calc (NIH) 80 0 - 99 mg/dL   Chol/HDL Ratio 2.3 0.0 - 4.4 ratio  Comprehensive metabolic panel  Result Value Ref Range   Glucose 75 70 - 99 mg/dL   BUN 12 6 - 20 mg/dL   Creatinine, Ser 5.78 0.57 - 1.00 mg/dL   eGFR 89 >46 NG/EXB/2.84    BUN/Creatinine Ratio 14 9 - 23   Sodium 138 134 - 144 mmol/L   Potassium 4.1 3.5 - 5.2 mmol/L   Chloride 104 96 - 106 mmol/L   CO2 21 20 - 29 mmol/L   Calcium 9.3 8.7 - 10.2 mg/dL   Total Protein 7.3 6.0 - 8.5 g/dL   Albumin 4.5 3.9 - 4.9 g/dL   Globulin, Total 2.8 1.5 - 4.5 g/dL   Bilirubin Total 0.6 0.0 - 1.2 mg/dL   Alkaline Phosphatase 60 44 - 121 IU/L   AST 18 0 - 40 IU/L   ALT 16 0 - 32 IU/L  CBC with Differential/Platelet  Result Value Ref Range   WBC 5.8 3.4 - 10.8 x10E3/uL   RBC 4.39 3.77 - 5.28 x10E6/uL   Hemoglobin 13.2 11.1 - 15.9 g/dL   Hematocrit 13.2 44.0 - 46.6 %   MCV 91 79 - 97 fL   MCH 30.1 26.6 - 33.0 pg   MCHC 33.0 31.5 - 35.7 g/dL   RDW 10.2 72.5 - 36.6 %   Platelets 308 150 - 450 x10E3/uL  Neutrophils 51 Not Estab. %   Lymphs 40 Not Estab. %   Monocytes 7 Not Estab. %   Eos 1 Not Estab. %   Basos 1 Not Estab. %   Neutrophils Absolute 3.0 1.4 - 7.0 x10E3/uL   Lymphocytes Absolute 2.3 0.7 - 3.1 x10E3/uL   Monocytes Absolute 0.4 0.1 - 0.9 x10E3/uL   EOS (ABSOLUTE) 0.0 0.0 - 0.4 x10E3/uL   Basophils Absolute 0.0 0.0 - 0.2 x10E3/uL   Immature Granulocytes 0 Not Estab. %   Immature Grans (Abs) 0.0 0.0 - 0.1 x10E3/uL  Hemoglobin A1c  Result Value Ref Range   Hgb A1c MFr Bld 4.9 4.8 - 5.6 %   Est. average glucose Bld gHb Est-mCnc 94 mg/dL  TSH  Result Value Ref Range   TSH 1.060 0.450 - 4.500 uIU/mL  POCT URINALYSIS DIP (CLINITEK)  Result Value Ref Range   Color, UA yellow yellow   Clarity, UA clear clear   Glucose, UA negative negative mg/dL   Bilirubin, UA negative negative   Ketones, POC UA negative negative mg/dL   Spec Grav, UA 8.841 6.606 - 1.025   Blood, UA moderate (A) negative   pH, UA 6.0 5.0 - 8.0   POC PROTEIN,UA negative negative, trace   Urobilinogen, UA 0.2 0.2 or 1.0 E.U./dL   Nitrite, UA Negative Negative   Leukocytes, UA Small (1+) (A) Negative    Assessment & Plan:  ***  No orders of the defined types were placed in this  encounter.   PATIENT COUNSELING:  - Encouraged a healthy well-balanced diet. Patient may adjust caloric intake to maintain or achieve ideal body weight. May reduce intake of dietary saturated fat and total fat and have adequate dietary potassium and calcium preferably from fresh fruits, vegetables, and low-fat dairy products.   - Advised to avoid cigarette smoking. - Discussed with the patient that most people either abstain from alcohol or drink within safe limits (<=14/week and <=4 drinks/occasion for males, <=7/weeks and <= 3 drinks/occasion for females) and that the risk for alcohol disorders and other health effects rises proportionally with the number of drinks per week and how often a drinker exceeds daily limits. - Discussed cessation/primary prevention of drug use and availability of treatment for abuse.  - Discussed sexually transmitted diseases, avoidance of unintended pregnancy and contraceptive alternatives.  - Stressed the importance of regular exercise - Injury prevention: Discussed safety belts, safety helmets, smoke detector, smoking near bedding or upholstery.  - Dental health: Discussed importance of regular tooth brushing, flossing, and dental visits.   NEXT PREVENTATIVE PHYSICAL DUE IN 1 YEAR.  No follow-ups on file.  Patient to reach out to office if new, worrisome, or unresolved symptoms arise or if no improvement in patient's condition. Patient verbalized understanding and is agreeable to treatment plan. All questions answered to patient's satisfaction.    Yolanda Manges, FNP

## 2023-01-12 ENCOUNTER — Encounter (HOSPITAL_BASED_OUTPATIENT_CLINIC_OR_DEPARTMENT_OTHER): Payer: Self-pay | Admitting: Family Medicine

## 2023-01-12 ENCOUNTER — Ambulatory Visit (INDEPENDENT_AMBULATORY_CARE_PROVIDER_SITE_OTHER): Payer: Medicaid Other | Admitting: Family Medicine

## 2023-01-12 VITALS — BP 129/88 | HR 68 | Ht 62.0 in | Wt 199.9 lb

## 2023-01-12 DIAGNOSIS — Z0000 Encounter for general adult medical examination without abnormal findings: Secondary | ICD-10-CM | POA: Diagnosis not present

## 2023-01-12 DIAGNOSIS — Z23 Encounter for immunization: Secondary | ICD-10-CM | POA: Diagnosis not present

## 2023-01-12 DIAGNOSIS — R0781 Pleurodynia: Secondary | ICD-10-CM | POA: Diagnosis not present

## 2023-01-12 DIAGNOSIS — R319 Hematuria, unspecified: Secondary | ICD-10-CM

## 2023-01-12 LAB — POCT URINALYSIS DIP (CLINITEK)
Bilirubin, UA: NEGATIVE
Glucose, UA: NEGATIVE mg/dL
Ketones, POC UA: NEGATIVE mg/dL
Leukocytes, UA: NEGATIVE
Nitrite, UA: NEGATIVE
Spec Grav, UA: 1.02 (ref 1.010–1.025)
Urobilinogen, UA: 1 E.U./dL
pH, UA: 8.5 — AB (ref 5.0–8.0)

## 2023-01-12 LAB — URINALYSIS, ROUTINE W REFLEX MICROSCOPIC
Bilirubin, UA: NEGATIVE
Glucose, UA: NEGATIVE
Ketones, UA: NEGATIVE
Leukocytes,UA: NEGATIVE
Nitrite, UA: NEGATIVE
Protein,UA: NEGATIVE
RBC, UA: NEGATIVE
Specific Gravity, UA: 1.021 (ref 1.005–1.030)
Urobilinogen, Ur: 1 mg/dL (ref 0.2–1.0)
pH, UA: 8.5 — ABNORMAL HIGH (ref 5.0–7.5)

## 2023-01-12 MED ORDER — CYCLOBENZAPRINE HCL 10 MG PO TABS: 10.0000 mg | ORAL_TABLET | Freq: Three times a day (TID) | ORAL | 0 refills | Status: DC | PRN

## 2023-01-12 NOTE — Patient Instructions (Addendum)
OBGYN (Schedule your Pap and IUD removal): [phone]   [facility] [address]: [phone]   Please use ice or heat regularly and lidocaine patches as needed. Use Ibuprofen 600-800mg  2-3x a day with food for the next 3-4 days.

## 2023-01-13 NOTE — Progress Notes (Signed)
No hematuria present in urine. No further testing needed.

## 2023-04-25 ENCOUNTER — Encounter (HOSPITAL_COMMUNITY): Payer: Self-pay

## 2023-04-25 ENCOUNTER — Ambulatory Visit (HOSPITAL_COMMUNITY)
Admission: RE | Admit: 2023-04-25 | Discharge: 2023-04-25 | Disposition: A | Discharge status: Home / Self Care | Payer: Medicaid Other | Source: Ambulatory Visit | Attending: Family | Admitting: Family

## 2023-04-25 VITALS — BP 113/75 | HR 72 | Temp 98.6°F | Resp 16 | Ht 62.0 in | Wt 198.0 lb

## 2023-04-25 DIAGNOSIS — Z1152 Encounter for screening for COVID-19: Secondary | ICD-10-CM

## 2023-04-25 DIAGNOSIS — J069 Acute upper respiratory infection, unspecified: Secondary | ICD-10-CM

## 2023-04-25 MED ORDER — BENZONATATE 100 MG PO CAPS: 200.0000 mg | ORAL_CAPSULE | Freq: Three times a day (TID) | ORAL | 0 refills | Status: DC | PRN

## 2023-04-25 NOTE — ED Provider Notes (Signed)
[facility]    CSN: [id number] Arrival date & time: 04/25/23  4098      History   Chief Complaint Chief Complaint  Patient presents with   Cough    sore throat, headache - Entered by patient   Headache   Sore Throat    HPI Angel Pineda is a 37 y.o. female.   37 year old female presents with cough, nasal congestion, sinus pressure for the past 3 days. Also having a frontal headache but denies any fever, nausea or vomiting. Has taken Nyquil with minimal relief. Other co-workers sick at work with ?viral illnesses. No other chronic health issues. Takes no daily medication.   The history is provided by the patient.    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 Authorizing Provider  benzonatate (TESSALON PERLES) 100 MG capsule Take 2 capsules (200 mg total) by mouth every 8 (eight) hours as needed for cough. 04/25/23  Yes Aprille Sawhney, Ali Lowe, NP  levonorgestrel (MIRENA) 20 MCG/DAY IUD 1 each by Intrauterine route once. Placed in 2019 per patient    [provider]    Family History Family History  Problem Relation Age of Onset   Hypertension Father    Diabetes Father    Diabetes Paternal  Grandmother    Hypertension Paternal Grandmother     Social History Social History   Tobacco Use   Smoking status: Former    Current packs/day: 0.00    Types: Cigarettes    Quit date: 2002    Years since quitting: 22.9   Smokeless tobacco: Never  Vaping Use   Vaping status: Never Used  Substance Use Topics   Alcohol use: No   Drug use: Not Currently    Types: Marijuana     Allergies   Patient has no known allergies.   Review of Systems Review of Systems  Constitutional:  Positive for fatigue. Negative for appetite change, chills, diaphoresis and fever.  HENT:  Positive for congestion, postnasal drip, sinus pressure and sore throat. Negative for ear discharge, ear pain, facial swelling, sinus pain and trouble swallowing.   Eyes:  Negative for discharge, redness and itching.  Respiratory:  Positive for cough. Negative for chest tightness and shortness of breath.   Gastrointestinal:  Negative for abdominal pain, nausea and vomiting.  Musculoskeletal:  Negative for arthralgias, myalgias, neck pain and neck stiffness.  Skin:  Negative for  color change and rash.  Allergic/Immunologic: Negative for environmental allergies, food allergies and immunocompromised state.  Neurological:  Positive for headaches. Negative for dizziness, tremors, seizures, syncope, speech difficulty and light-headedness.  Hematological:  Negative for adenopathy. Does not bruise/bleed easily.     Physical Exam Triage Vital Signs ED Triage Vitals  Encounter Vitals Group     BP 04/25/23 0950 113/75     Systolic BP Percentile --      Diastolic BP Percentile --      Pulse Rate 04/25/23 0950 72     Resp 04/25/23 0950 16     Temp 04/25/23 0950 98.6 F (37 C)     Temp Source 04/25/23 0950 Oral     SpO2 04/25/23 0950 99 %     Weight 04/25/23 0947 198 lb (89.8 kg)     Height 04/25/23 0947 5\' 2"  (1.575 m)     Head Circumference --      Peak Flow --      Pain Score 04/25/23 0946 6     Pain Loc --       Pain Education --      Exclude from Growth Chart --    No data found.  Updated Vital Signs BP 113/75 (BP Location: Right Arm)   Pulse 72   Temp 98.6 F (37 C) (Oral)   Resp 16   Ht 5\' 2"  (1.575 m)   Wt 198 lb (89.8 kg)   LMP  (Within Days) Comment: pt reports her period came last month, unsure of the day.  SpO2 99%   BMI 36.21 kg/m   Visual Acuity Right Eye Distance:   Left Eye Distance:   Bilateral Distance:    Right Eye Near:   Left Eye Near:    Bilateral Near:     Physical Exam Vitals and nursing note reviewed.  Constitutional:      General: She is awake. She is not in acute distress.    Appearance: She is well-developed. She is ill-appearing.     Comments: She is sitting on the exam table in no acute distress but appears tired and ill.  HENT:     Head: Normocephalic and atraumatic.     Right Ear: Hearing, tympanic membrane, ear canal and external ear normal.     Left Ear: Hearing, tympanic membrane, ear canal and external ear normal.     Nose: Congestion present.     Right Sinus: No maxillary sinus tenderness or frontal sinus tenderness.     Left Sinus: No maxillary sinus tenderness or frontal sinus tenderness.     Mouth/Throat:     Lips: Pink.     Mouth: Mucous membranes are moist.     Pharynx: Uvula midline. Posterior oropharyngeal erythema and postnasal drip present. No pharyngeal swelling, oropharyngeal exudate or uvula swelling.  Eyes:     Extraocular Movements: Extraocular movements intact.     Conjunctiva/sclera: Conjunctivae normal.  Cardiovascular:     Rate and Rhythm: Normal rate and regular rhythm.     Heart sounds: Normal heart sounds. No murmur heard. Pulmonary:     Effort: Pulmonary effort is normal. No respiratory distress.     Breath sounds: Normal breath sounds and air entry. No decreased air movement. No decreased breath sounds, wheezing, rhonchi or rales.  Musculoskeletal:     Cervical back: Normal range of motion and neck supple.   Lymphadenopathy:     Cervical: No cervical adenopathy.  Skin:    General: Skin is warm and dry.  Capillary Refill: Capillary refill takes less than 2 seconds.     Findings: No rash.  Neurological:     General: No focal deficit present.     Mental Status: She is alert and oriented to person, place, and time.  Psychiatric:        Mood and Affect: Mood normal.        Behavior: Behavior normal. Behavior is cooperative.        Thought Content: Thought content normal.        Judgment: Judgment normal.      UC Treatments / Results  Labs (all labs ordered are listed, but only abnormal results are displayed) Labs Reviewed  SARS CORONAVIRUS 2 (TAT 6-24 HRS)    EKG   Radiology No results found.  Procedures Procedures (including critical care time)  Medications Ordered in UC Medications - No data to display  Initial Impression / Assessment and Plan / UC Course  I have reviewed the triage vital signs and the nursing notes.  Pertinent labs & imaging results that were available during my care of the patient were reviewed by me and considered in my medical decision making (see chart for details).     Reviewed with patient that she appears to have a viral illness. Doubt Influenza or strep. Will test for COVID. May take Tessalon cough pills 200 mg every 8 hours as needed. May take OTC Dayquil or Sudafed 60mg  every 6 hours as needed for congestion. Continue to push fluids to help loosen up mucus in sinuses and chest. Rest. Note written for work. Follow-up pending COVID test results.   Even if she is positive for COVID- recommend symptomatic treatment- no antiviral medication indicated. But may need additional work note.  Final Clinical Impressions(s) / UC Diagnoses   Final diagnoses:  Viral URI with cough  Encounter for screening for COVID-19     Discharge Instructions      Recommend start Tessalon cough pills- take 1 every 8 hours as needed for cough- should not cause  drowsiness. May continue OTC Dayquil during the day as needed for congestion or take OTC Sudafed 60mg  every 6 hours as needed. Continue to push fluids to help loosen up mucus in sinuses and chest. Rest. Follow-up pending COVID test results.     ED Prescriptions     Medication Sig Dispense Auth. Provider   benzonatate (TESSALON PERLES) 100 MG capsule Take 2 capsules (200 mg total) by mouth every 8 (eight) hours as needed for cough. 15 capsule Hindy Perrault, Ali Lowe, NP      PDMP not reviewed this encounter.   Sudie Grumbling, NP 04/25/23 2206

## 2023-04-25 NOTE — ED Triage Notes (Signed)
Pt presents with cough, sore throat, and headache x 3 days. Pt states she took Nyquil for her symptoms with no improvement. Pt currently rates her throat & headache pain a 6/10.

## 2023-04-25 NOTE — Discharge Instructions (Signed)
Recommend start Tessalon cough pills- take 1 every 8 hours as needed for cough- should not cause drowsiness. May continue OTC Dayquil during the day as needed for congestion or take OTC Sudafed 60mg  every 6 hours as needed. Continue to push fluids to help loosen up mucus in sinuses and chest. Rest. Follow-up pending COVID test results.

## 2023-04-26 LAB — SARS CORONAVIRUS 2 (TAT 6-24 HRS): SARS Coronavirus 2: NEGATIVE

## 2023-05-03 ENCOUNTER — Emergency Department (HOSPITAL_COMMUNITY)
Admission: EM | Admit: 2023-05-03 | Discharge: 2023-05-03 | Disposition: A | Discharge status: Home / Self Care | Payer: Medicaid Other | Attending: Emergency Medicine | Admitting: Emergency Medicine

## 2023-05-03 ENCOUNTER — Other Ambulatory Visit: Payer: Self-pay

## 2023-05-03 ENCOUNTER — Encounter (HOSPITAL_COMMUNITY): Payer: Self-pay | Admitting: Emergency Medicine

## 2023-05-03 DIAGNOSIS — M5432 Sciatica, left side: Secondary | ICD-10-CM | POA: Insufficient documentation

## 2023-05-03 DIAGNOSIS — M545 Low back pain, unspecified: Secondary | ICD-10-CM | POA: Diagnosis present

## 2023-05-03 MED ORDER — KETOROLAC TROMETHAMINE 15 MG/ML IJ SOLN
15.0000 mg | Freq: Once | INTRAMUSCULAR | Status: AC
  Administered 2023-05-03: 15 mg via INTRAMUSCULAR
  Filled 2023-05-03: qty 1

## 2023-05-03 MED ORDER — PREDNISONE 10 MG (21) PO TBPK: ORAL_TABLET | Freq: Every day | ORAL | 0 refills | Status: DC

## 2023-05-03 MED ORDER — METHOCARBAMOL 500 MG PO TABS
500.0000 mg | ORAL_TABLET | Freq: Once | ORAL | Status: AC
  Administered 2023-05-03: 500 mg via ORAL
  Filled 2023-05-03: qty 1

## 2023-05-03 MED ORDER — LIDOCAINE 5 % EX PTCH
2.0000 | MEDICATED_PATCH | CUTANEOUS | Status: DC
  Administered 2023-05-03: 2 via TRANSDERMAL
  Filled 2023-05-03: qty 2

## 2023-05-03 NOTE — ED Provider Notes (Signed)
[place] EMERGENCY DEPARTMENT AT [facility] Provider Note   CSN: [id number] Arrival date & time: 05/03/23  8295     History  Chief Complaint  Patient presents with   Back Pain    Angel Pineda is a 37 y.o. female with a past medical history of sciatica presents to emergency department for evaluation of right eye left-sided lower back pain that radiates into her left leg.  She reports this feeling similar to sciatica flares in the past.  She denies IVDU, malignancy, saddle paresthesia, incontinence, fevers, urinary symptoms   Back Pain Associated symptoms: no abdominal pain, no chest pain, no fever, no headaches, no numbness and no weakness      Home Medications Prior to Admission medications   Medication Sig Start Date End Date Taking? Authorizing Provider  benzonatate (TESSALON PERLES) 100 MG capsule Take 2 capsules (200 mg total) by mouth every 8 (eight) hours as needed for cough. 04/25/23   Sudie Grumbling, NP  levonorgestrel (MIRENA) 20 MCG/DAY IUD 1 each by Intrauterine route once. Placed in 2019 per patient    [provider]  predniSONE (STERAPRED UNI-PAK 21 TAB) 10 MG (21) TBPK tablet Take by mouth daily. Take 6 tabs by mouth daily  for 2 days, then 5 tabs for 2 days, then 4 tabs for 2 days, then 3 tabs for 2 days, 2 tabs for 2 days, then 1 tab by mouth daily for 2 days 05/03/23  Yes Judithann Sheen, PA      Allergies    Patient has no known allergies.    Review of Systems   Review of Systems  Constitutional:  Negative for chills, fatigue and fever.  Respiratory:  Negative for cough, chest tightness, shortness of breath and wheezing.   Cardiovascular:  Negative for chest pain and palpitations.  Gastrointestinal:  Negative for abdominal pain, constipation, diarrhea, nausea and vomiting.  Musculoskeletal:  Positive for back pain.  Neurological:  Negative for dizziness, seizures, weakness, light-headedness, numbness and headaches.    Physical  Exam Updated Vital Signs BP 110/67 (BP Location: Left Arm)   Pulse 80   Temp 98.1 F (36.7 C) (Oral)   Resp 16   Ht 5\' 2"  (1.575 m)   Wt 89.8 kg   LMP  (Within Days)   SpO2 100%   BMI 36.21 kg/m  Physical Exam Vitals and nursing note reviewed.  Constitutional:      General: She is not in acute distress.    Appearance: Normal appearance. She is not ill-appearing.  HENT:     Head: Normocephalic and atraumatic.  Eyes:     Conjunctiva/sclera: Conjunctivae normal.  Cardiovascular:     Rate and Rhythm: Normal rate.     Pulses:          Dorsalis pedis pulses are 2+ on the right side and 2+ on the left side.  Pulmonary:     Effort: Pulmonary effort is normal. No respiratory distress.  Musculoskeletal:        General: No deformity.     Cervical back: Normal range of motion and neck supple. No rigidity or tenderness.     Right lower leg: No tenderness. No edema.     Left lower leg: No tenderness. No edema.     Right foot: Normal range of motion.     Left foot: Normal range of motion.  Skin:    General: Skin is warm.     Capillary Refill: Capillary refill takes less than 2  seconds.     Coloration: Skin is not jaundiced or pale.  Neurological:     Mental Status: She is alert and oriented to person, place, and time. Mental status is at baseline.     Cranial Nerves: No cranial nerve deficit.     Sensory: No sensory deficit.     Motor: No weakness.     Coordination: Coordination normal.     Gait: Gait normal.     Deep Tendon Reflexes: Reflexes normal.    ED Results / Procedures / Treatments   Labs (all labs ordered are listed, but only abnormal results are displayed) Labs Reviewed - No data to display  EKG None  Radiology No results found.  Procedures Procedures    Medications Ordered in ED Medications  ketorolac (TORADOL) 15 MG/ML injection 15 mg (has no administration in time range)  methocarbamol (ROBAXIN) tablet 500 mg (has no administration in time range)   lidocaine (LIDODERM) 5 % 2 patch (has no administration in time range)    ED Course/ Medical Decision Making/ A&P                                 Medical Decision Making Risk Prescription drug management.   Patient presents to the ED for concern of left back pain that radiates into left leg, this involves an extensive number of treatment options, and is a complaint that carries with it a high risk of complications and morbidity.  The differential diagnosis includes sciatica flare, UTI, pyelo, muscle strain, stone. This is not an exhaustive list   Co morbidities that complicate the patient evaluation  PMHx of sciatica   Additional history obtained:  Additional history obtained from Nursing and Outside Medical Records   External records from outside source obtained and reviewed including ED visit from 12/02/2022, 11/28/2022, and 07/19/2020 for similar symptoms of sciatica   Medicines ordered and prescription drug management:  I ordered medication including Robaxin, lidocaine patch, Toradol for pain Reevaluation of the patient after these medicines showed that the patient improved I have reviewed the patients home medicines and have made adjustments as needed    Problem List / ED Course:  Sciatica + L SLR without neuro deficits (See PE) Will provide Toradol, Robaxin, lidocaine patch in the emergency department -she has a ride home from her family Will provide steroid burst as patient had 1 in July and reports that this made her feel better She has a PCP appointment already scheduled and plans to follow-up with them   Reevaluation:  After the interventions noted above, I reevaluated the patient and found that they have :improved   Social Determinants of Health:  Has pcp f/u   Dispostion:  After consideration of the diagnostic results and the patients response to treatment, I feel that the patent would benefit from outpatient management and pcp f/u.   Final Clinical  Impression(s) / ED Diagnoses Final diagnoses:  Sciatica of left side    Rx / DC Orders ED Discharge Orders          Ordered    predniSONE (STERAPRED UNI-PAK 21 TAB) 10 MG (21) TBPK tablet  Daily        05/03/23 0649             Judithann Sheen, PA 05/03/23 7564    Tilden Fossa, MD 05/03/23 [phone]

## 2023-05-03 NOTE — Discharge Instructions (Addendum)
Thank you for letting us evaluate you today.  This appears to be a flare of your sciatica.  We gave you a strong ibuprofen shot, lidocaine patch, muscle relaxer here in the emergency department.  I have sent a steroid pack to your pharmacy for you to take as directed. You may take Ibuprofen or alleve, lidocaine patches, icyhot, heating pads at home for pain relief until you can see your PCP  Please return to emergency department if you experience loss of urinary or fecal incontinence, loss of sensation in genital region, worsening symptoms.

## 2023-05-03 NOTE — ED Triage Notes (Signed)
Pt BIB EMS coming from home c/o sciatic nerve pain to her left side. HX of sciatica nerve pain but states yesterday at work it started bothering her again. The pain worsen at midnight.   BP138/80, HR 80, RR 20, Spo2 99%

## 2023-05-04 ENCOUNTER — Ambulatory Visit (INDEPENDENT_AMBULATORY_CARE_PROVIDER_SITE_OTHER): Payer: Medicaid Other | Admitting: Family Medicine

## 2023-05-04 ENCOUNTER — Other Ambulatory Visit (HOSPITAL_BASED_OUTPATIENT_CLINIC_OR_DEPARTMENT_OTHER): Payer: Self-pay

## 2023-05-04 VITALS — BP 115/74 | HR 61 | Ht 62.0 in | Wt 207.1 lb

## 2023-05-04 DIAGNOSIS — M5442 Lumbago with sciatica, left side: Secondary | ICD-10-CM

## 2023-05-04 MED ORDER — PREDNISONE 10 MG PO TABS
ORAL_TABLET | Freq: Every day | ORAL | 0 refills | Status: DC
  Filled 2023-05-04: qty 42, 12d supply, fill #0

## 2023-05-04 MED ORDER — CYCLOBENZAPRINE HCL 5 MG PO TABS
5.0000 mg | ORAL_TABLET | Freq: Two times a day (BID) | ORAL | 0 refills | Status: DC | PRN
  Filled 2023-05-04: qty 20, 10d supply, fill #0

## 2023-05-04 NOTE — Progress Notes (Signed)
Acute Office Visit  Subjective:     Patient ID: Arlyle Strate Mullett, female    DOB: 1986-01-25, 37 y.o.   MRN: [id number]  Chief Complaint  Patient presents with   Leg Pain    Left side sciatic nerve flare, going down her leg. Pain not as bad as yesterday, still present. Not as bad when sitting   Jandra Arvidson is a 37 year old female patient who presents for left leg pain. "The pain has gotten a little better, more when I walk." She reports the pain is a 5/10. Denies numbness/tingling in her leg, bowel or bladder incontinence, fever/chills, saddle anesthesia. She has not picked up her medication due to the pharmacy being closed.   Review of Systems  Constitutional:  Negative for chills and fever.  Respiratory:  Negative for cough and shortness of breath.   Cardiovascular:  Negative for chest pain and palpitations.  Gastrointestinal:  Negative for nausea and vomiting.  Genitourinary:  Negative for frequency and urgency.  Musculoskeletal:  Positive for back pain (low back into left leg).  Neurological:  Negative for dizziness, tingling, weakness and headaches.  Psychiatric/Behavioral:  Negative for depression and suicidal ideas. The patient is not nervous/anxious and does not have insomnia.        Objective:    BP 115/74   Pulse 61   Ht 5\' 2"  (1.575 m)   Wt 207 lb 1.6 oz (93.9 kg)   LMP  (Within Days)   SpO2 100%   BMI 37.88 kg/m   Physical Exam Vitals reviewed.  Constitutional:      Appearance: Normal appearance.  Cardiovascular:     Rate and Rhythm: Normal rate and regular rhythm.     Pulses: Normal pulses.     Heart sounds: Normal heart sounds.  Pulmonary:     Effort: Pulmonary effort is normal.     Breath sounds: Normal breath sounds.  Musculoskeletal:     Cervical back: Normal. No tenderness. No pain with movement. Normal range of motion.     Thoracic back: Normal. No bony tenderness. Normal range of motion.     Lumbar back: Positive left straight leg raise test.  Negative right straight leg raise test.  Neurological:     Mental Status: She is alert.  Psychiatric:        Mood and Affect: Mood normal.        Behavior: Behavior normal.    Assessment & Plan:   1. Acute left-sided low back pain with left-sided sciatica (Primary) Patient presents today for ED follow-up. She was seen in the ED yesterday for left back pain that radiates to the left leg. She was diagnosed with left-sided sciatica with an increase in pain with walking and lying supine. She reports relief with toradol and robaxin given in the ED. She has not picked up her prednisone, since the pharmacy was closed. Physical exam with + L SLR without neuro deficits. Denies numbness/tingling, incontinence, and saddle anesthesia. Advised patient to start prednisone, sent to [facility] pharmacy for convenience. Sent in muscle relaxer for PRN use. Follow-up if no improvement with steroid burst.  - predniSONE (DELTASONE) 10 MG tablet; Take by mouth daily. Take 6 tabs by mouth daily  for 2 days, then 5 tabs for 2 days, then 4 tabs for 2 days, then 3 tabs for 2 days, 2 tabs for 2 days, then 1 tab by mouth daily for 2 days  Dispense: 42 tablet; Refill: 0 - cyclobenzaprine (FLEXERIL) 5 MG tablet; Take 1 tablet (  5 mg total) by mouth 2 (two) times daily as needed for muscle spasms.  Dispense: 20 tablet; Refill: 0   Return if symptoms worsen or fail to improve.  Alyson Reedy, FNP

## 2023-06-08 ENCOUNTER — Ambulatory Visit (HOSPITAL_BASED_OUTPATIENT_CLINIC_OR_DEPARTMENT_OTHER): Payer: Medicaid Other | Admitting: Certified Nurse Midwife

## 2023-06-08 ENCOUNTER — Encounter (HOSPITAL_BASED_OUTPATIENT_CLINIC_OR_DEPARTMENT_OTHER): Payer: Self-pay | Admitting: Radiology

## 2023-06-08 ENCOUNTER — Encounter (HOSPITAL_BASED_OUTPATIENT_CLINIC_OR_DEPARTMENT_OTHER): Payer: Self-pay | Admitting: Certified Nurse Midwife

## 2023-06-08 ENCOUNTER — Other Ambulatory Visit (HOSPITAL_COMMUNITY)
Admission: RE | Admit: 2023-06-08 | Discharge: 2023-06-08 | Disposition: A | Discharge status: Home / Self Care | Payer: Medicaid Other | Source: Ambulatory Visit | Attending: Certified Nurse Midwife | Admitting: Certified Nurse Midwife

## 2023-06-08 ENCOUNTER — Ambulatory Visit (HOSPITAL_BASED_OUTPATIENT_CLINIC_OR_DEPARTMENT_OTHER)
Admission: RE | Admit: 2023-06-08 | Discharge: 2023-06-08 | Disposition: A | Discharge status: Home / Self Care | Payer: Medicaid Other | Source: Ambulatory Visit | Attending: Certified Nurse Midwife | Admitting: Certified Nurse Midwife

## 2023-06-08 VITALS — BP 120/80 | HR 62 | Ht 62.0 in | Wt 210.6 lb

## 2023-06-08 DIAGNOSIS — Z30432 Encounter for removal of intrauterine contraceptive device: Secondary | ICD-10-CM | POA: Diagnosis not present

## 2023-06-08 DIAGNOSIS — Z124 Encounter for screening for malignant neoplasm of cervix: Secondary | ICD-10-CM | POA: Diagnosis present

## 2023-06-08 DIAGNOSIS — Z01419 Encounter for gynecological examination (general) (routine) without abnormal findings: Secondary | ICD-10-CM | POA: Diagnosis not present

## 2023-06-08 DIAGNOSIS — Z1231 Encounter for screening mammogram for malignant neoplasm of breast: Secondary | ICD-10-CM | POA: Diagnosis present

## 2023-06-08 DIAGNOSIS — Z6838 Body mass index (BMI) 38.0-38.9, adult: Secondary | ICD-10-CM

## 2023-06-08 MED ORDER — PRENATAL PLUS 27-1 MG PO TABS: 1.0000 | ORAL_TABLET | Freq: Every day | ORAL | 4 refills | Status: DC

## 2023-06-08 NOTE — Progress Notes (Signed)
38 y.o. G36P3003 Engaged Black or [place] American female here for annual exam. She has 3 daughters. She requests removal of Mirena IUD. [name] would like for her to consider a pregnancy. He has 2 children (youngest is 39). Pt reports hot flashes and night sweats. She is able to get back to sleep after 30-45 minutes.  Patient's last menstrual period was 05/29/2023.          Sexually active: Yes.      [facility] - 06/08/23 0949       Pregnancy Intention Screening   Does the patient want to become pregnant in the next year? Unsure    Does the patient's partner want to become pregnant in the next year? Unsure    Would the patient like to discuss contraceptive options today? Yes             Exercising: Yes.     Smoker:  no  Health Maintenance: Pap:  Last pap unsure History of abnormal Pap:  no MMG:  Pt will consider 1st mammogram today Colonoscopy:  n/a BMD:   n/a Screening Labs: PCP   reports that she quit smoking about 23 years ago. Her smoking use included cigarettes. She has never used smokeless tobacco. She reports that she does not currently use drugs after having used the following drugs: Marijuana. She reports that she does not drink alcohol.  Past Medical History:  Diagnosis Date   Chlamydia    Trichomonas infection    Urinary tract infection     Past Surgical History:  Procedure Laterality Date   DILATION AND EVACUATION N/A 02/21/2018   Procedure: DILATATION AND EVACUATION;  Surgeon: Levie Heritage, DO;  Location: [facility];  Service: Gynecology;  Laterality: N/A;   VAGINAL DELIVERY      Current Outpatient Medications  Medication Sig Dispense Refill   cyclobenzaprine (FLEXERIL) 5 MG tablet Take 1 tablet (5 mg total) by mouth 2 (two) times daily as needed for muscle spasms. 20 tablet 0   levonorgestrel (MIRENA) 20 MCG/DAY IUD 1 each by Intrauterine route once. Placed in 2019 per patient     predniSONE (DELTASONE) 10 MG tablet Take by mouth daily. Take  6 tabs by mouth daily  for 2 days, then 5 tabs for 2 days, then 4 tabs for 2 days, then 3 tabs for 2 days, 2 tabs for 2 days, then 1 tab by mouth daily for 2 days (Patient not taking: Reported on 06/08/2023) 42 tablet 0   No current facility-administered medications for this visit.    Family History  Problem Relation Age of Onset   Hypertension Father    Diabetes Father    Diabetes Paternal Grandmother    Hypertension Paternal Grandmother     ROS: Constitutional: negative Genitourinary:negative  Exam:   BP 120/80 (BP Location: Left Arm, Patient Position: Sitting, Cuff Size: Large)   Pulse 62   Ht 5\' 2"  (1.575 m)   Wt 210 lb 9.6 oz (95.5 kg)   LMP 05/29/2023   BMI 38.52 kg/m   Height: 5\' 2"  (157.5 cm)  General appearance: alert, cooperative and appears stated age Head: Normocephalic, without obvious abnormality, atraumatic Neck: no adenopathy, supple, symmetrical, trachea midline and thyroid normal to inspection and palpation Lungs: clear to auscultation bilaterally Breasts: normal appearance, no masses or tenderness, Inspection negative, No nipple retraction or dimpling, No nipple discharge or bleeding, No axillary or supraclavicular adenopathy, Normal to palpation without dominant masses Heart: regular rate and rhythm Abdomen: soft, non-tender; bowel sounds  normal; no masses,  no organomegaly Extremities: extremities normal, atraumatic, no cyanosis or edema Skin: Skin color, texture, turgor normal. No rashes or lesions Lymph nodes: Cervical, supraclavicular, and axillary nodes normal. No abnormal inguinal nodes palpated Neurologic: Grossly normal   Pelvic: External genitalia:  no lesions              Urethra:  normal appearing urethra with no masses, tenderness or lesions              Bartholins and Skenes: normal                 Vagina: normal appearing vagina with normal color and no discharge, no lesions              Cervix: multiparous appearance, no bleeding following  Pap, no cervical motion tenderness, no lesions, and IUD                Pap taken: Yes.   Bimanual Exam:  Uterus:  normal size, contour, position, consistency, mobility, non-tender              Adnexa: no mass, fullness, tenderness               Rectovaginal: Confirms               Anus:  normal sphincter tone, no lesions  Chaperone, Hendricks Milo, CMA, was present for exam.  Assessment/Plan: 1. Gynecologic exam normal - discussed annual screening mammograms  2. Encounter for screening mammogram for malignant neoplasm of breast (Primary) - MM 3D SCREENING MAMMOGRAM BILATERAL BREAST; Future  3. Cervical cancer screening - Cytology - PAP( [facility])  4. Encounter for removal of intrauterine contraceptive device - IUD grasped with ring forcep and removed without difficulty.  5. PreConception - Start prenatal vitamin daily (Rx sent) - Normal A1C (No prediabetes or diabetes)  Letta Kocher

## 2023-06-13 LAB — CYTOLOGY - PAP
Chlamydia: POSITIVE — AB
Comment: NEGATIVE
Comment: NEGATIVE
Comment: NEGATIVE
Comment: NORMAL
Diagnosis: NEGATIVE
High risk HPV: NEGATIVE
Neisseria Gonorrhea: NEGATIVE
Trichomonas: NEGATIVE

## 2023-06-14 ENCOUNTER — Other Ambulatory Visit (HOSPITAL_BASED_OUTPATIENT_CLINIC_OR_DEPARTMENT_OTHER): Payer: Self-pay | Admitting: Certified Nurse Midwife

## 2023-06-14 ENCOUNTER — Telehealth (HOSPITAL_BASED_OUTPATIENT_CLINIC_OR_DEPARTMENT_OTHER): Payer: Self-pay | Admitting: Certified Nurse Midwife

## 2023-06-14 DIAGNOSIS — A749 Chlamydial infection, unspecified: Secondary | ICD-10-CM | POA: Insufficient documentation

## 2023-06-14 MED ORDER — DOXYCYCLINE HYCLATE 100 MG PO CAPS: 100.0000 mg | ORAL_CAPSULE | Freq: Two times a day (BID) | ORAL | 0 refills | Status: DC

## 2023-06-14 NOTE — Telephone Encounter (Signed)
Pt notified via telephone at 8:42am that Chlamydia was positive and treatment is needed. Pt aware that her [name] will need to be treated as well.   Angel Pineda

## 2023-08-28 ENCOUNTER — Ambulatory Visit (HOSPITAL_BASED_OUTPATIENT_CLINIC_OR_DEPARTMENT_OTHER): Admitting: *Deleted

## 2023-08-28 VITALS — BP 125/75 | HR 61 | Ht 62.0 in | Wt 207.0 lb

## 2023-08-28 DIAGNOSIS — Z3202 Encounter for pregnancy test, result negative: Secondary | ICD-10-CM

## 2023-08-28 DIAGNOSIS — N926 Irregular menstruation, unspecified: Secondary | ICD-10-CM

## 2023-08-28 LAB — POCT URINE PREGNANCY: Preg Test, Ur: NEGATIVE

## 2023-08-28 NOTE — Progress Notes (Unsigned)
   NURSE VISIT- PREGNANCY CONFIRMATION   SUBJECTIVE:  Angel Pineda is a 38 y.o. G75P3003 female at Unknown by uncertain LMP of Patient's last menstrual period was 07/31/2023 (approximate). Here for pregnancy confirmation.  Home pregnancy test: positive x 1   She reports no complaints.  She is taking prenatal vitamins.    OBJECTIVE:  BP 125/75 (Cuff Size: Large)   Pulse 61   Ht 5\' 2"  (1.575 m)   Wt 207 lb (93.9 kg)   LMP 07/31/2023 (Approximate)   BMI 37.86 kg/m   Appears well, in no apparent distress  No results found for this or any previous visit (from the past 24 hours).  ASSESSMENT: Negative pregnancy test    PLAN: Prenatal vitamins: continue   Nausea medicines: not currently needed   Hcg lab drawn   Maudine Sos, RN

## 2023-08-29 ENCOUNTER — Encounter (HOSPITAL_BASED_OUTPATIENT_CLINIC_OR_DEPARTMENT_OTHER): Payer: Self-pay | Admitting: Certified Nurse Midwife

## 2023-08-29 LAB — BETA HCG QUANT (REF LAB): hCG Quant: <1 m[IU]/mL

## 2023-11-23 ENCOUNTER — Ambulatory Visit (HOSPITAL_BASED_OUTPATIENT_CLINIC_OR_DEPARTMENT_OTHER): Admitting: Certified Nurse Midwife

## 2024-01-16 ENCOUNTER — Encounter (HOSPITAL_BASED_OUTPATIENT_CLINIC_OR_DEPARTMENT_OTHER): Payer: Medicaid Other | Admitting: Family Medicine

## 2024-01-28 ENCOUNTER — Telehealth: Payer: Self-pay

## 2024-01-28 ENCOUNTER — Telehealth: Admitting: Family Medicine

## 2024-01-28 DIAGNOSIS — M5442 Lumbago with sciatica, left side: Secondary | ICD-10-CM

## 2024-01-28 MED ORDER — PREDNISONE 20 MG PO TABS: 20.0000 mg | ORAL_TABLET | Freq: Two times a day (BID) | ORAL | 0 refills | Status: AC

## 2024-01-28 MED ORDER — BACLOFEN 10 MG PO TABS
10.0000 mg | ORAL_TABLET | Freq: Three times a day (TID) | ORAL | 0 refills | Status: DC
Start: 2024-01-28 — End: 2024-04-03

## 2024-01-28 NOTE — Progress Notes (Signed)
 Virtual Visit Consent   Angel Pineda, you are scheduled for a virtual visit with a [facility] [facility] provider today. Just as with appointments in the office, your consent must be obtained to participate. Your consent will be active for this visit and any virtual visit you may have with one of our providers in the next 365 days. If you have a MyChart account, a copy of this consent can be sent to you electronically.  As this is a virtual visit, video technology does not allow for your provider to perform a traditional examination. This may limit your provider's ability to fully assess your condition. If your provider identifies any concerns that need to be evaluated in person or the need to arrange testing (such as labs, EKG, etc.), we will make arrangements to do so. Although advances in technology are sophisticated, we cannot ensure that it will always work on either your end or our end. If the connection with a video visit is poor, the visit may have to be switched to a telephone visit. With either a video or telephone visit, we are not always able to ensure that we have a secure connection.  By engaging in this virtual visit, you consent to the provision of healthcare and authorize for your insurance to be billed (if applicable) for the services provided during this visit. Depending on your insurance coverage, you may receive a charge related to this service.  I need to obtain your verbal consent now. Are you willing to proceed with your visit today? Angel Pineda has provided verbal consent on 01/28/2024 for a virtual visit (video or telephone). Loa Lamp, FNP  Date: 01/28/2024 10:54 AM   Virtual Visit via Video Note   I, Loa Lamp, connected with  Angel Pineda  ([id number], 1986/04/26) on 01/28/24 at 10:45 AM EDT by a video-enabled telemedicine application and verified that I am speaking with the correct person using two identifiers.  Location: Patient: Virtual Visit Location Patient:  Home Provider: Virtual Visit Location Provider: Home Office   I discussed the limitations of evaluation and management by telemedicine and the availability of in person appointments. The patient expressed understanding and agreed to proceed.    History of Present Illness: Angel Pineda is a 38 y.o. who identifies as a female who was assigned female at birth, and is being seen today for left sided sciatica with a history of same. Pain in left lower back in to buttocks. NKI. Prednisone  and muscle relaxant helped in the past. Also requests work note. 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 (Patient not taking: Reported on 08/28/2023), Disp: 20 tablet, Rfl: 0   prenatal vitamin w/FE, FA (PRENATAL 1 + 1) 27-1  MG TABS tablet, Take 1 tablet by mouth daily at 12 noon., Disp: 100 tablet, Rfl: 4  Observations/Objective: Patient is well-developed, well-nourished in no acute distress.  Resting comfortably  at home.  Head is normocephalic, atraumatic.  No labored breathing.  Speech is clear and coherent with logical content.  Patient is alert and oriented at baseline.    Assessment and Plan: 1. Acute left-sided low back pain with left-sided sciatica  (Primary)  Heat, med use discussed, follow up with pcp as needed.   Follow Up Instructions: I discussed the assessment and treatment plan with the patient. The patient was provided an opportunity to ask questions and all were answered. The patient agreed with the plan and demonstrated an understanding of the instructions.  A copy of instructions were sent to the patient via MyChart unless otherwise noted below.     The patient was advised to call back or seek an in-person evaluation if the symptoms worsen or if the condition fails to improve as anticipated.    Angel Gardenhire, FNP

## 2024-01-28 NOTE — Patient Instructions (Signed)
Sciatica  Sciatica is pain, numbness, weakness, or tingling along the path of the sciatic nerve. The sciatic nerve starts in the lower back and runs down the back of each leg. The nerve controls the muscles in the lower leg and in the back of the knee. It also provides feeling (sensation) to the back of the thigh, the lower leg, and the sole of the foot. Sciatica is a symptom of another medical condition that pinches or puts pressure on the sciatic nerve. Sciatica most often only affects one side of the body. Sciatica usually goes away on its own or with treatment. In some cases, sciatica may come back (recur). What are the causes? This condition is caused by pressure on the sciatic nerve or pinching of the nerve. This may be the result of: A disk in between the bones of the spine bulging out too far (herniated disk). Age-related changes in the spinal disks. A pain disorder that affects a muscle in the buttock. Extra bone growth near the sciatic nerve. A break (fracture) of the pelvis. Pregnancy. Tumor. This is rare. What increases the risk? The following factors may make you more likely to develop this condition: Playing sports that place pressure or stress on the spine. Having poor strength and flexibility. A history of back injury or surgery. Sitting for long periods of time. Doing activities that involve repetitive bending or lifting. Obesity. What are the signs or symptoms? Symptoms can vary from mild to very severe. They may include: Any of the following problems in the lower back, leg, hip, or buttock: Mild tingling, numbness, or dull aches. Burning sensations. Sharp pains. Numbness in the back of the calf or the sole of the foot. Leg weakness. Severe back pain that makes movement difficult. Symptoms may get worse when you cough, sneeze, or laugh, or when you sit or stand for long periods of time. How is this diagnosed? This condition may be diagnosed based on: Your symptoms  and medical history. A physical exam. Blood tests. Imaging tests, such as: X-rays. An MRI. A CT scan. How is this treated? In many cases, this condition improves on its own without treatment. However, treatment may include: Reducing or modifying physical activity. Exercising, including strengthening and stretching. Icing and applying heat to the affected area. Medicines that help to: Relieve pain and swelling. Relax your muscles. Injections of medicines that help to relieve pain and inflammation (steroids) around the sciatic nerve. Surgery. Follow these instructions at home: Medicines Take over-the-counter and prescription medicines only as told by your health care provider. Ask your health care provider if the medicine prescribed to you requires you to avoid driving or using heavy machinery. Managing pain     If directed, put ice on the affected area. To do this: Put ice in a plastic bag. Place a towel between your skin and the bag. Leave the ice on for 20 minutes, 2-3 times a day. If your skin turns bright red, remove the ice right away to prevent skin damage. The risk of skin damage is higher if you cannot feel pain, heat, or cold. If directed, apply heat to the affected area as often as told by your health care provider. Use the heat source that your health care provider recommends, such as a moist heat pack or a heating pad. Place a towel between your skin and the heat source. Leave the heat on for 20-30 minutes. If your skin turns bright red, remove the heat right away to prevent G[place]chall. The   risk of Jansma is higher if you cannot feel pain, heat, or cold. Activity  Return to your normal activities as told by your health care provider. Ask your health care provider what activities are safe for you. Avoid activities that make your symptoms worse. Take brief periods of rest throughout the day. When you rest for longer periods, mix in some mild activity or stretching between  periods of rest. This will help to prevent stiffness and pain. Avoid sitting for long periods of time without moving. Get up and move around at least one time each hour. Exercise and stretch regularly as told by your health care provider. Do not lift anything that is heavier than 10 lb (4.5 kg) until your health care provider says that it is safe. When you do not have symptoms, you should still avoid heavy lifting, especially repetitive heavy lifting. When you lift objects, always use proper lifting technique, which includes: Bending your knees. Keeping the load close to your body. Avoiding twisting. General instructions Maintain a healthy weight. Excess weight puts extra stress on your back. Wear supportive, comfortable shoes. Avoid wearing high heels. Avoid sleeping on a mattress that is too soft or too hard. A mattress that is firm enough to support your back when you sleep may help to reduce your pain. Contact a health care provider if: Your pain is not controlled by medicine. Your pain does not improve or gets worse. Your pain lasts longer than 4 weeks. You have unexplained weight loss. Get help right away if: You are not able to control when you urinate or have bowel movements (incontinence). You have: Weakness in your lower back, pelvis, buttocks, or legs that gets worse. Redness or swelling of your back. A burning sensation when you urinate. Summary Sciatica is pain, numbness, weakness, or tingling along the path of the sciatic nerve, which may include the lower back, legs, hips, and buttocks. This condition is caused by pressure on the sciatic nerve or pinching of the nerve. Treatment often includes rest, exercise, medicines, and applying ice or heat. This information is not intended to replace advice given to you by your health care provider. Make sure you discuss any questions you have with your health care provider. Document Revised: 08/09/2021 Document Reviewed:  08/09/2021 Elsevier Patient Education  2024 [employer]  

## 2024-04-03 ENCOUNTER — Ambulatory Visit

## 2024-04-03 ENCOUNTER — Other Ambulatory Visit: Payer: Self-pay

## 2024-04-03 ENCOUNTER — Ambulatory Visit
Admission: RE | Admit: 2024-04-03 | Discharge: 2024-04-03 | Disposition: A | Discharge status: Home / Self Care | Attending: Nurse Practitioner | Admitting: Nurse Practitioner

## 2024-04-03 VITALS — BP 125/84 | HR 60 | Temp 98.3°F | Resp 16 | Wt 211.6 lb

## 2024-04-03 DIAGNOSIS — R2243 Localized swelling, mass and lump, lower limb, bilateral: Secondary | ICD-10-CM | POA: Insufficient documentation

## 2024-04-03 DIAGNOSIS — R350 Frequency of micturition: Secondary | ICD-10-CM | POA: Diagnosis present

## 2024-04-03 LAB — POCT URINE DIPSTICK
Glucose, UA: NEGATIVE mg/dL
Leukocytes, UA: NEGATIVE
Nitrite, UA: POSITIVE — AB
Protein Ur, POC: >=300 mg/dL — AB
Spec Grav, UA: 1.025 (ref 1.010–1.025)
Urobilinogen, UA: 1 U/dL
pH, UA: 6 (ref 5.0–8.0)

## 2024-04-03 MED ORDER — NITROFURANTOIN MONOHYD MACRO 100 MG PO CAPS: 100.0000 mg | ORAL_CAPSULE | Freq: Two times a day (BID) | ORAL | 0 refills | Status: DC

## 2024-04-03 NOTE — Discharge Instructions (Addendum)
 Your symptoms of leg swelling and urinary frequency were evaluated today. Your vital signs and exam were reassuring, and there was no significant swelling, redness, or tenderness in either leg. Your pulses, heart, and lungs were normal, which makes conditions like blood clots or heart failure unlikely. Your urine test showed signs that could suggest a urinary tract infection, so we started you on antibiotics while we wait for the urine culture to confirm the exact cause. We also sent bloodwork to check your blood counts, kidney function, electrolytes, and blood sugar levels to look for any metabolic or kidney-related reasons for your symptoms.  Please take the antibiotic exactly as prescribed and drink plenty of water throughout the day to help your body flush the infection. You can elevate your legs when resting, avoid sitting or standing for long periods, and walk periodically to help with circulation. If you notice more swelling, try wearing supportive socks if they are comfortable. Mild tingling can occur with swelling and should improve as your symptoms get better.  Please follow up with your primary care provider to recheck your symptoms and review your lab results. Call sooner if the swelling increases, becomes painful, or does not improve within a few days. Go to the emergency department immediately if you develop sudden or worsening leg swelling, new redness or warmth in either leg, chest pain, trouble breathing, rapid heartbeat, fainting, weakness, inability to walk, or any other symptoms that feel severe or concerning.

## 2024-04-03 NOTE — ED Triage Notes (Addendum)
 Pt presents with a chief complaint of bilateral leg swelling (intermittent). Noticed about 3-4 days ago. Left side is more swollen than right per pt description. No pain, legs just feel very tight. Does endorse some urinary frequency too. No medications taken PTA for symptoms reported.

## 2024-04-03 NOTE — ED Provider Notes (Signed)
 [facility]    CSN: [id number] Arrival date & time: 04/03/24  1814      History   Chief Complaint Chief Complaint  Patient presents with   Leg Swelling    Entered by patient    HPI Angel Pineda is a 38 y.o. female.   Discussed the use of AI scribe software for clinical note transcription with the patient, who gave verbal consent to proceed.   The patient presents with a several-day history of bilateral leg swelling that she first noticed when her legs and ankles began feeling tight, particularly when trying to put on her shoes. The swelling involves both legs with mild ankle involvement. She reports pain in both legs, described as aching in the back of the left calf and discomfort in the front of the right leg. She also notes intermittent tingling sensations in her feet or legs. She denies any recent travel, long car rides, flights, or prolonged immobility.  The patient additionally reports increased urinary frequency but denies dysuria, urgency, flank pain, hematuria, or changes in urine color. She denies chest pain, shortness of breath, palpitations, dizziness, headaches, vision changes, photophobia, numbness, gait problems, or swelling in the hands. She reports no history of high blood pressure, diabetes, kidney disease, blood clots, or cancer treatment. She is not on birth control, hormone therapy, or any new medications. She denies increased thirst or excessive fluid intake. This is her first episode of leg swelling, and she has not attempted any treatment or interventions prior to arrival.  The following sections of the patient's history were reviewed and updated as appropriate: allergies, current medications, past family history, past medical history, past social history, past surgical history, and problem list.       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 Authorizing Provider  nitrofurantoin , macrocrystal-monohydrate, (MACROBID ) 100 MG capsule Take 1 capsule (100 mg total) by mouth 2 (two) times daily. 04/03/24  Yes Iola Lukes, FNP  prenatal vitamin w/FE, FA (PRENATAL 1 + 1) 27-1 MG TABS tablet Take 1 tablet by mouth daily at 12 noon. 06/08/23   Tad Arland POUR, CNM    Family History Family History  Problem Relation Age of Onset   Hypertension Father    Diabetes Father    Diabetes Paternal Grandmother  Hypertension Paternal Grandmother     Social History Social History   Tobacco Use   Smoking status: Former    Current packs/day: 0.00    Types: Cigarettes    Quit date: 2002    Years since quitting: 23.8   Smokeless tobacco: Never  Vaping Use   Vaping status: Never Used  Substance Use Topics   Alcohol use: No   Drug use: Not Currently    Types: Marijuana     Allergies   Patient has no known allergies.   Review of Systems Review of Systems  Eyes:  Negative for photophobia and visual disturbance.  Respiratory:  Negative for shortness of breath.   Cardiovascular:  Positive for leg swelling (L>R, feels tight, mild pain in the  left calf). Negative for chest pain and palpitations.  Endocrine: Negative for polydipsia and polyphagia.  Genitourinary:  Positive for frequency. Negative for dysuria and urgency.  Musculoskeletal:  Negative for gait problem.  Neurological:  Negative for dizziness, weakness, light-headedness and numbness.       Mild tingling within the left leg  All other systems reviewed and are negative.    Physical Exam Triage Vital Signs ED Triage Vitals  Encounter Vitals Group     BP 04/03/24 1914 125/84     Girls Systolic BP Percentile --      Girls Diastolic BP Percentile --      Boys Systolic BP Percentile --      Boys Diastolic BP Percentile --      Pulse Rate 04/03/24 1914 60     Resp 04/03/24 1914 16     Temp 04/03/24 1914 98.3 F (36.8 C)     Temp Source 04/03/24 1914 Oral     SpO2 04/03/24 1914 98 %     Weight 04/03/24 1910 211 lb 9.6 oz (96 kg)     Height --      Head Circumference --      Peak Flow --      Pain Score 04/03/24 1912 0     Pain Loc --      Pain Education --      Exclude from Growth Chart --    No data found.  Updated Vital Signs BP 125/84 (BP Location: Right Arm)   Pulse 60   Temp 98.3 F (36.8 C) (Oral)   Resp 16   Wt 211 lb 9.6 oz (96 kg)   LMP 04/03/2024 (Exact Date)   SpO2 98%   BMI 38.70 kg/m   Visual Acuity Right Eye Distance:   Left Eye Distance:   Bilateral Distance:    Right Eye Near:   Left Eye Near:    Bilateral Near:     Physical Exam Vitals reviewed.  Constitutional:      General: She is awake. She is not in acute distress.    Appearance: Normal appearance. She is well-developed. She is not ill-appearing, toxic-appearing or diaphoretic.  HENT:     Head: Normocephalic.     Right Ear: Hearing normal.     Left Ear: Hearing normal.     Nose: Nose normal.     Mouth/Throat:     Mouth: Mucous membranes are moist.  Eyes:     General: Vision grossly intact.     Conjunctiva/sclera: Conjunctivae normal.  Cardiovascular:     Rate  and Rhythm: Normal rate and regular rhythm.     Pulses: Normal pulses.     Heart sounds: Normal heart sounds.  Pulmonary:  Effort: Pulmonary effort is normal.     Breath sounds: Normal breath sounds and air entry.  Musculoskeletal:        General: Normal range of motion.     Cervical back: Normal range of motion and neck supple.     Right lower leg: No edema.     Left lower leg: No edema.  Skin:    General: Skin is warm and dry.  Neurological:     General: No focal deficit present.     Mental Status: She is alert and oriented to person, place, and time.  Psychiatric:        Speech: Speech normal.        Behavior: Behavior is cooperative.      UC Treatments / Results  Labs (all labs ordered are listed, but only abnormal results are displayed) Labs Reviewed  POCT URINE DIPSTICK - Abnormal; Notable for the following components:      Result Value   Color, UA red (*)    Clarity, UA cloudy (*)    Bilirubin, UA small (*)    Ketones, POC UA trace (5) (*)    Blood, UA large (*)    Protein Ur, POC >=300 (*)    Nitrite, UA Positive (*)    All other components within normal limits  CBC WITH DIFFERENTIAL/PLATELET  COMPREHENSIVE METABOLIC PANEL WITH GFR  HEMOGLOBIN A1C    EKG   Radiology No results found.  Procedures Procedures (including critical care time)  Medications Ordered in UC Medications - No data to display  Initial Impression / Assessment and Plan / UC Course  I have reviewed the triage vital signs and the nursing notes.  Pertinent labs & imaging results that were available during my care of the patient were reviewed by me and considered in my medical decision making (see chart for details).  The patient presents with several days of bilateral leg swelling accompanied by calf discomfort and intermittent tingling. She is normotensive, and her vital signs are stable. Physical examination is reassuring, showing no appreciable edema, no calf swelling or  tenderness, intact pedal pulses, and normal cardiovascular and pulmonary findings. In the absence of unilateral swelling, erythema, warmth, or focal tenderness, the likelihood of deep vein thrombosis is low. Her normal cardiopulmonary exam and lack of shortness of breath make heart failure unlikely. Given her report of increased urinary frequency, further evaluation for metabolic or renal contributors is appropriate, and CBC, CMP, and A1C have been ordered. Urinalysis revealed nitrites and RBCs without leukocytes, raising concern for a possible urinary tract infection, and empiric treatment has been initiated while awaiting urine culture results. She was advised to monitor her symptoms closely and follow up with her primary care provider for reassessment or sooner if symptoms worsen or new symptoms develop.  Today's evaluation has revealed no signs of a dangerous process. Discussed diagnosis with patient and/or guardian. Patient and/or guardian aware of their diagnosis, possible red flag symptoms to watch out for and need for close follow up. Patient and/or guardian understands verbal and written discharge instructions. Patient and/or guardian comfortable with plan and disposition.  Patient and/or guardian has a clear mental status at this time, good insight into illness (after discussion and teaching) and has clear judgment to make decisions regarding their care  Documentation was completed with the aid of voice recognition software. Transcription may contain typographical errors.   Final Clinical Impressions(s) / UC Diagnoses   Final diagnoses:  Urinary frequency  Localized swelling of both lower  legs     Discharge Instructions      Your symptoms of leg swelling and urinary frequency were evaluated today. Your vital signs and exam were reassuring, and there was no significant swelling, redness, or tenderness in either leg. Your pulses, heart, and lungs were normal, which makes conditions like  blood clots or heart failure unlikely. Your urine test showed signs that could suggest a urinary tract infection, so we started you on antibiotics while we wait for the urine culture to confirm the exact cause. We also sent bloodwork to check your blood counts, kidney function, electrolytes, and blood sugar levels to look for any metabolic or kidney-related reasons for your symptoms.  Please take the antibiotic exactly as prescribed and drink plenty of water throughout the day to help your body flush the infection. You can elevate your legs when resting, avoid sitting or standing for long periods, and walk periodically to help with circulation. If you notice more swelling, try wearing supportive socks if they are comfortable. Mild tingling can occur with swelling and should improve as your symptoms get better.  Please follow up with your primary care provider to recheck your symptoms and review your lab results. Call sooner if the swelling increases, becomes painful, or does not improve within a few days. Go to the emergency department immediately if you develop sudden or worsening leg swelling, new redness or warmth in either leg, chest pain, trouble breathing, rapid heartbeat, fainting, weakness, inability to walk, or any other symptoms that feel severe or concerning.     ED Prescriptions     Medication Sig Dispense Auth. Provider   nitrofurantoin , macrocrystal-monohydrate, (MACROBID ) 100 MG capsule Take 1 capsule (100 mg total) by mouth 2 (two) times daily. 10 capsule Iola Lukes, FNP      PDMP not reviewed this encounter.   Iola Lukes, [place] 04/03/24 2042

## 2024-04-04 ENCOUNTER — Ambulatory Visit (HOSPITAL_COMMUNITY): Payer: Self-pay

## 2024-04-04 LAB — COMPREHENSIVE METABOLIC PANEL WITH GFR
ALT: 13 IU/L (ref 0–32)
AST: 16 IU/L (ref 0–40)
Albumin: 4.5 g/dL (ref 3.9–4.9)
Alkaline Phosphatase: 50 IU/L (ref 41–116)
BUN/Creatinine Ratio: 9 (ref 9–23)
BUN: 7 mg/dL (ref 6–24)
Bilirubin Total: 0.5 mg/dL (ref 0.0–1.2)
CO2: 22 mmol/L (ref 20–29)
Calcium: 8.9 mg/dL (ref 8.7–10.2)
Chloride: 104 mmol/L (ref 96–106)
Creatinine, Ser: 0.74 mg/dL (ref 0.57–1.00)
Globulin, Total: 2.5 g/dL (ref 1.5–4.5)
Glucose: 80 mg/dL (ref 70–99)
Potassium: 3.5 mmol/L (ref 3.5–5.2)
Sodium: 137 mmol/L (ref 134–144)
Total Protein: 7 g/dL (ref 6.0–8.5)
eGFR: 105 mL/min/1.73 (ref >59)

## 2024-04-04 LAB — CBC WITH DIFFERENTIAL/PLATELET
Basophils Absolute: 0 x10E3/uL (ref 0.0–0.2)
Basos: 0 %
EOS (ABSOLUTE): 0 x10E3/uL (ref 0.0–0.4)
Eos: 0 %
Hematocrit: 39.5 % (ref 34.0–46.6)
Hemoglobin: 13.2 g/dL (ref 11.1–15.9)
Immature Grans (Abs): 0 x10E3/uL (ref 0.0–0.1)
Immature Granulocytes: 0 %
Lymphocytes Absolute: 2.7 x10E3/uL (ref 0.7–3.1)
Lymphs: 46 %
MCH: 30.7 pg (ref 26.6–33.0)
MCHC: 33.4 g/dL (ref 31.5–35.7)
MCV: 92 fL (ref 79–97)
Monocytes Absolute: 0.4 x10E3/uL (ref 0.1–0.9)
Monocytes: 6 %
Neutrophils Absolute: 2.8 x10E3/uL (ref 1.4–7.0)
Neutrophils: 48 %
Platelets: 262 x10E3/uL (ref 150–450)
RBC: 4.3 x10E6/uL (ref 3.77–5.28)
RDW: 13 % (ref 11.7–15.4)
WBC: 6 x10E3/uL (ref 3.4–10.8)

## 2024-04-04 LAB — HEMOGLOBIN A1C
Est. average glucose Bld gHb Est-mCnc: 97 mg/dL
Hgb A1c MFr Bld: 5 % (ref 4.8–5.6)

## 2024-04-05 LAB — URINE CULTURE: Culture: NO GROWTH

## 2024-04-14 ENCOUNTER — Telehealth: Admitting: Family

## 2024-04-14 DIAGNOSIS — M79662 Pain in left lower leg: Secondary | ICD-10-CM | POA: Diagnosis not present

## 2024-04-14 NOTE — Progress Notes (Signed)
 Virtual Visit Consent   Angel Pineda, you are scheduled for a virtual visit with a [facility] provider today. Just as with appointments in the office, your consent must be obtained to participate. Your consent will be active for this visit and any virtual visit you may have with one of our providers in the next 365 days. If you have a MyChart account, a copy of this consent can be sent to you electronically.  As this is a virtual visit, video technology does not allow for your provider to perform a traditional examination. This may limit your provider's ability to fully assess your condition. If your provider identifies any concerns that need to be evaluated in person or the need to arrange testing (such as labs, EKG, etc.), we will make arrangements to do so. Although advances in technology are sophisticated, we cannot ensure that it will always work on either your end or our end. If the connection with a video visit is poor, the visit may have to be switched to a telephone visit. With either a video or telephone visit, we are not always able to ensure that we have a secure connection.  By engaging in this virtual visit, you consent to the provision of healthcare and authorize for your insurance to be billed (if applicable) for the services provided during this visit. Depending on your insurance coverage, you may receive a charge related to this service.  I need to obtain your verbal consent now. Are you willing to proceed with your visit today? Angel Pineda has provided verbal consent on 04/14/2024 for a virtual visit (video or telephone). Bari Learn, FNP  Date: 04/14/2024 3:11 PM   Virtual Visit via Video Note   I, Bari Learn, connected with  Angel Pineda  ([id number], 27-Dec-1985) on 04/14/24 at  3:15 PM EST by a video-enabled telemedicine application and verified that I am speaking with the correct person using two identifiers.  Location: Patient: Virtual Visit Location Patient:  Home Provider: Virtual Visit Location Provider: Home Office   I discussed the limitations of evaluation and management by telemedicine and the availability of in person appointments. The patient expressed understanding and agreed to proceed.    History of Present Illness: Angel Pineda is a 38 y.o. who identifies as a female who was assigned female at birth, and is being seen today for left leg swelling that started last week, but hast resolved. However, having sharp pains that started yesterday that comes and goes. State the pain is a 7 out 10, but can be a 10 out 10 at times.   Denies any injury.   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  Resting comfortably  at home.  Head is normocephalic, atraumatic.  No labored breathing.  Speech is clear and coherent with  logical content.  Patient is alert and oriented at baseline.  Left calf pain  Assessment and Plan: 1. Pain of left calf (Primary)  Pt needs to be seen in person today for US  to rule out DVT.  Will more than likely have to go to ED to get US  Work note given   Follow Up Instructions: I discussed the assessment and treatment plan with the patient. The patient was provided an opportunity to ask questions  and all were answered. The patient agreed with the plan and demonstrated an understanding of the instructions.  A copy of instructions were sent to the patient via MyChart unless otherwise noted below.     The patient was advised to call back or seek an in-person evaluation if the symptoms worsen or if the condition fails to improve as anticipated.    Bari Learn, FNP

## 2024-04-14 NOTE — Patient Instructions (Signed)
 Venous Thromboembolism Prevention Venous thromboembolism (VTE) is a condition that includes deep vein thrombosis (DVT) and pulmonary embolism (PE). A DVT is a blood clot, also called a thrombus, that occurs in a deep vein. It usually occurs in the leg but can also occur in the pelvis, arm, or neck. Sometimes, pieces of a blood clot can break off and travel through the bloodstream to other parts of the body. When that happens, the blood clot is called an embolus. An embolus that travels to the arteries of the lungs is called a pulmonary embolism, and it can severely impair the function of the lungs, heart, and blood vessels. An embolism can block the blood flow in the blood vessels of other organs as well. How can this condition affect me? VTE is a serious health condition that can cause disability or death. It is very important to get help right away. Do not ignore your symptoms. What can increase my risk? You are more likely to develop this condition if you: Have had recent major surgery. Have had recent major trauma, such as a broken bone (fracture) or an injury to an organ. Have certain health conditions, such as cancer or a blood disorder that increases the risk for blood clots. Are hospitalized for an illness. Are wearing a splint or cast for a bone fracture and are unable to move that extremity for long periods of time. Have a personal or family history of VTE. Are 40 years of age or older. Other risks include: Taking certain medicines, such as birth control pills or hormone replacement therapy. Being pregnant or recently giving birth. Being overweight. Using products that contain nicotine and tobacco. Not moving for a long period of time. This may include being on bed rest or long-distance travel in an airplane or car. What actions can I take to prevent this? If you are in the hospital: A VTE may be prevented by taking medicines that are prescribed to prevent blood clots  (anticoagulants). You can also help to prevent VTE while in the hospital by taking these actions: Get out of bed and walk. This keeps blood moving through the veins, which decreases the risk of developing VTE. Ask your health care provider if this is safe for you to do. Ask your health care provider if you should use a sequential compression device (SCD). This is a machine that pumps air into compression sleeves that are wrapped around your legs. Ask your health care provider if you should wear tight, elastic stockings that apply pressure to the lower legs (compression stockings). Compression stockings are sometimes used with SCDs. At home after a surgery If you had surgery, understand that you have an increased risk for VTE for the first 4-6 weeks after the surgery. During this time: Avoid traveling for more than 4 hours. If you must travel after surgery, ask your health care provider about additional preventive actions that you can take, such as frequent breaks to move around and walk. Avoid sitting or lying still for too long. If possible, get up and walk around one time every hour. Ask your health care provider when this is safe for you to do. Stay active as directed by your health care provider. While traveling Travel that takes more than 4 hours can increase the risk of a VTE. To prevent VTE when traveling: Exercise your arms and legs every hour. You can do this by standing, stretching, and bending and straightening your arms and legs. If you are traveling by airplane, train,  or bus, walk up and down the aisle as often as possible to get your blood moving. If you are traveling by car, stop and get out of the car every hour to walk, stretch, and exercise your arms and legs. Other types of exercise might include: Keeping your feet flat on the ground and raising your toes. Switching from tightening the muscles in your calves and thighs to relaxing those same muscles while you are sitting. Pointing  and flexing your feet at the ankle joints while you are sitting. Drink enough water to keep your urine pale yellow. Wear compression stockings during long travel periods. Avoid drinking alcohol during long travel. Generally, it is not recommended that you take medicines to prevent DVT during routine travel. Other tips Some other actions you can take in your daily life to help prevent blood clots include:     Stay active. Avoid sitting or lying in bed for long periods of time without moving your arms and legs. Exercise by moving your arms and legs for 30 minutes or more every day. Avoid crossing your legs when you are sitting. Wear compression stockings as told by your health care provider. These stockings help to prevent blood clots and reduce swelling in your legs. Do not let them bunch up when you are wearing them. Avoid wearing other tight clothing around your legs or waist. Do not use any products that contain nicotine or tobacco. These products include cigarettes, chewing tobacco, and vaping devices, such as e-cigarettes. This is especially important if you take estrogen medicines. If you need help quitting, ask your health care provider. Avoid using medicines that contain estrogen if you do not need them. These include birth control pills and hormone replacement therapy. Maintain a healthy weight. Where to find more information Centers for Disease Control and Prevention: footballexhibition.com.br American Heart Association: www.heart.org National Heart, Lung, and Blood Institute: popsteam.is Get help right away if: You have chest pain or shortness of breath. You cough up blood. You have a rapid or irregular heartbeat. You feel light-headed or dizzy. You have new or increased pain, swelling, or redness in an arm or leg. You have numbness or tingling in an arm or leg. You are on blood thinning medicines and have a sudden severe headache or blood in your vomit, stool, or urine. These  symptoms may be an emergency. Get help right away. Call 911. Do not wait to see if the symptoms will go away. Do not drive yourself to the hospital. Summary Venous thromboembolism (VTE) is a condition that includes deep vein thrombosis (DVT) and pulmonary embolism (PE). VTE is a serious health condition that can cause disability or death. It is very important to get help right away. Do not ignore your symptoms. Risk factors include immobility, trauma to the veins, major surgeries, and certain medical conditions that increase the risk of forming blood clots. Avoid sitting or lying in bed for long periods of time without moving your arms and legs. Get help right away if you are on blood thinning medicines and have a sudden severe headache or blood in your vomit, stool, or urine. This information is not intended to replace advice given to you by your health care provider. Make sure you discuss any questions you have with your health care provider. Document Revised: 12/15/2020 Document Reviewed: 11/23/2020 Elsevier Patient Education  2024 [employer].

## 2024-04-29 ENCOUNTER — Encounter (HOSPITAL_BASED_OUTPATIENT_CLINIC_OR_DEPARTMENT_OTHER): Admitting: Family Medicine

## 2024-05-15 ENCOUNTER — Ambulatory Visit (HOSPITAL_COMMUNITY)
Admission: RE | Admit: 2024-05-15 | Discharge: 2024-05-15 | Disposition: A | Discharge status: Home / Self Care | Source: Ambulatory Visit

## 2024-05-15 ENCOUNTER — Ambulatory Visit (INDEPENDENT_AMBULATORY_CARE_PROVIDER_SITE_OTHER)

## 2024-05-15 ENCOUNTER — Encounter (HOSPITAL_BASED_OUTPATIENT_CLINIC_OR_DEPARTMENT_OTHER): Payer: Self-pay

## 2024-05-15 VITALS — BP 109/73 | HR 74 | Ht 62.0 in | Wt 206.2 lb

## 2024-05-15 DIAGNOSIS — M5442 Lumbago with sciatica, left side: Secondary | ICD-10-CM

## 2024-05-15 DIAGNOSIS — M7989 Other specified soft tissue disorders: Secondary | ICD-10-CM

## 2024-05-15 MED ORDER — PREDNISONE 20 MG PO TABS: 20.0000 mg | ORAL_TABLET | Freq: Every day | ORAL | 0 refills | Status: AC

## 2024-05-15 NOTE — Patient Instructions (Signed)
 You will have an xray done today  They will call to schedule an appointment for your US .  If you experience: -severe leg pain -change in sensation -change in the color of your leg -redness or swelling Please seek emergency care.

## 2024-05-15 NOTE — Addendum Note (Signed)
 Addended by: Nataliah Hatlestad ELIZABETH on: 05/15/2024 11:33 AM   Modules accepted: Orders

## 2024-05-15 NOTE — Progress Notes (Signed)
 "     Acute Care Office Visit  Subjective:   Angel Pineda [birth year] 05/15/2024  Chief Complaint  Patient presents with   Medical Management of Chronic Issues    Patient is here for sciatic pain and swelling in her legs. States that it is mainly left leg gets swollen. Has some concerns about blood clots and is requesting a scan.     HPI: Angel Pineda is a 38 yo female who presents today for leg swelling and sciatica pain. States sciatica pain is intermittent. States left leg swelling began about 2-3 weeks ago and states it is present throughout the day but worse at night. Denies any recent trauma or injury. Denies recent travel. Is not on any oral contraceptives but does smoke.    The following portions of the patient's history were reviewed and updated as appropriate: past medical history, past surgical history, family history, social history, allergies, medications, and problem list.   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   Gestational hypertension 02/20/2018   Past Medical History:  Diagnosis Date   Chlamydia    Trichomonas infection    Urinary tract infection    Past Surgical History:  Procedure Laterality Date   DILATION AND EVACUATION N/A 02/21/2018   Procedure: DILATATION AND EVACUATION;  Surgeon: Barbra Lang PARAS, DO;  Location: [facility];  Service: Gynecology;  Laterality: N/A;   VAGINAL DELIVERY     Family History  Problem Relation Age of Onset   Hypertension Father    Diabetes Father    Alcohol abuse Father    Drug abuse Father    Diabetes Paternal Grandmother    Hypertension Paternal Grandmother    Drug abuse Mother    Outpatient Medications Prior to Visit  Medication Sig Dispense Refill   nitrofurantoin , macrocrystal-monohydrate,  (MACROBID ) 100 MG capsule Take 1 capsule (100 mg total) by mouth 2 (two) times daily. 10 capsule 0   prenatal vitamin w/FE, FA (PRENATAL 1 + 1) 27-1 MG TABS tablet Take 1 tablet by mouth daily at 12 noon. 100 tablet 4   No facility-administered medications prior to visit.   Allergies[1]   ROS: A complete ROS was performed with pertinent positives/negatives noted in the HPI. The remainder of the ROS are negative.    Objective:   Today's Vitals   05/15/24 1024  BP: 109/73  Pulse: 74  SpO2: 98%  Weight: 206 lb 3.2 oz (93.5 kg)  Height: 5' 2 (1.575 m)    GENERAL: Well-appearing, in NAD. Well nourished.  SKIN: Pink, warm and dry. No rash, lesion, ulceration, or ecchymoses.  RESPIRATORY: Chest wall symmetrical. Respirations even and non-labored. Breath sounds clear to auscultation bilaterally.  CARDIAC: S1, S2 present, regular rate and rhythm without murmur or gallops. Peripheral pulses 2+ bilaterally.  MSK: Muscle tone and strength appropriate for age. Joints w/o tenderness, redness, or swelling.  EXTREMITIES: Without clubbing, cyanosis, or edema. Left leg normal in color, temperature, and appearance without pain, erythema, or edema. Pedal pulses 2+ bilaterally. NEUROLOGIC: No motor or sensory deficits. Steady, even gait. C2-C12 intact.  PSYCH/MENTAL STATUS: Alert, oriented x 3. Cooperative, appropriate mood and affect.    No results found for any visits on 05/15/24.    Assessment & Plan:  1. Acute left-sided low back pain with left-sided sciatica (Primary) Discussed  referrals to orthopedics and physical therapy as she has not had imaging nor seen orthopedics. I discussed going ahead and having her followed by PT to help with pain and movement to hopefully decrease sciatica symptoms. Dicussed short term steroid to try and decrease any inflammation to the area. Ordering lumbar xray today. - predniSONE  (DELTASONE ) 20 MG tablet; Take 1 tablet (20 mg total) by mouth daily with breakfast  for 4 days.  Dispense: 4 tablet; Refill: 0 - Ambulatory referral to Physical Therapy - Ambulatory referral to Orthopedic Surgery - DG Lumbar Spine 2-3 Views; Future  2. Left leg swelling After examination, it appears there is no acute concern for a blood clot. Bilateral lower extremities were equal in size, color, sensation, and temperature. I did discuss ordered an US  to rule out any other cause to her intermittent edema. I discussed in length red flag symptoms an when to be seen by ER. Pt verbalized understanding.  - VAS US  LOWER EXTREMITY VENOUS REFLUX   Meds ordered this encounter  Medications   predniSONE  (DELTASONE ) 20 MG tablet    Sig: Take 1 tablet (20 mg total) by mouth daily with breakfast for 4 days.    Dispense:  4 tablet    Refill:  0    Return if symptoms worsen or fail to improve.    Patient to reach out to office if new, worrisome, or unresolved symptoms arise or if no improvement in patient's condition. Patient verbalized understanding and is agreeable to treatment plan. All questions answered to patient's satisfaction.    Lauraine Almarie Angus DNP, FNP-C       [1] No Known Allergies  "

## 2024-05-20 ENCOUNTER — Ambulatory Visit (HOSPITAL_BASED_OUTPATIENT_CLINIC_OR_DEPARTMENT_OTHER): Payer: Self-pay

## 2024-06-13 ENCOUNTER — Encounter (HOSPITAL_BASED_OUTPATIENT_CLINIC_OR_DEPARTMENT_OTHER): Payer: Self-pay

## 2024-06-13 ENCOUNTER — Ambulatory Visit (HOSPITAL_BASED_OUTPATIENT_CLINIC_OR_DEPARTMENT_OTHER): Admitting: Orthopaedic Surgery

## 2024-06-18 ENCOUNTER — Other Ambulatory Visit: Payer: Self-pay | Admitting: Medical Genetics

## 2024-07-17 ENCOUNTER — Encounter (HOSPITAL_BASED_OUTPATIENT_CLINIC_OR_DEPARTMENT_OTHER): Admitting: Family Medicine
